# Patient Record
Sex: Female | Born: 1990 | Race: Black or African American | Hispanic: No | Marital: Single | Smoking: Never smoker
Health system: Southern US, Community
[De-identification: ages and names within clinical notes are randomized; demographics above are authoritative.]

## PROBLEM LIST (undated history)

## (undated) DIAGNOSIS — G4700 Insomnia, unspecified: Secondary | ICD-10-CM

## (undated) DIAGNOSIS — F419 Anxiety disorder, unspecified: Secondary | ICD-10-CM

## (undated) DIAGNOSIS — K219 Gastro-esophageal reflux disease without esophagitis: Secondary | ICD-10-CM

## (undated) DIAGNOSIS — T7840XA Allergy, unspecified, initial encounter: Secondary | ICD-10-CM

## (undated) DIAGNOSIS — D649 Anemia, unspecified: Secondary | ICD-10-CM

## (undated) HISTORY — DX: Insomnia, unspecified: G47.00

## (undated) HISTORY — DX: Anemia, unspecified: D64.9

## (undated) HISTORY — DX: Allergy, unspecified, initial encounter: T78.40XA

## (undated) HISTORY — PX: NO PAST SURGERIES: SHX2092

## (undated) HISTORY — DX: Anxiety disorder, unspecified: F41.9

---
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
## 2014-08-17 ENCOUNTER — Ambulatory Visit (INDEPENDENT_AMBULATORY_CARE_PROVIDER_SITE_OTHER): Payer: Self-pay | Admitting: Family Medicine

## 2014-08-17 ENCOUNTER — Encounter: Payer: Self-pay | Admitting: Family Medicine

## 2014-08-17 VITALS — BP 132/96 | HR 86 | Temp 98.4°F | Resp 16 | Ht 68.0 in | Wt 304.0 lb

## 2014-08-17 DIAGNOSIS — J309 Allergic rhinitis, unspecified: Secondary | ICD-10-CM | POA: Insufficient documentation

## 2014-08-17 DIAGNOSIS — K219 Gastro-esophageal reflux disease without esophagitis: Secondary | ICD-10-CM

## 2014-08-17 DIAGNOSIS — N946 Dysmenorrhea, unspecified: Secondary | ICD-10-CM

## 2014-08-17 MED ORDER — SUCRALFATE 1 G PO TABS: ORAL_TABLET | ORAL | Status: DC

## 2014-08-17 MED ORDER — NORETHINDRONE ACET-ETHINYL EST 1-20 MG-MCG PO TABS: 1.0000 | ORAL_TABLET | Freq: Every day | ORAL | Status: DC

## 2014-08-17 NOTE — Progress Notes (Signed)
Subjective:     Patient ID: Brianna Villarreal, female   DOB: November 03, 1990, 24 y.o.   MRN: [id number]  HPI  Chief Complaint  Patient presents with  . Menstrual Problem    Patient comes in office today to discuss heavy menstrual cycles. Patient states that over the past year menses have become more heavy and she has had increading pain in her abdomen and lower back described as cramping. Patient states that she has tried taking otc Midol, Naproxen and pamprin with little relief.   Reports she is not sexually active. Also states she gets nocturnal reflux which will occasionally wake her up at night. Has tried omeprazole for  4 weeks with little improvement. Denies tobacco or significant caffeine use. Has started going to a gym.   Review of Systems  HENT:       Reports allergy exacerbation in the late Spring which culminated in her going to an ER with wheezing.  Genitourinary:       States menses are regular for 3-6 days with the first days heavy. First day of LMP 7/26.       Objective:   Physical Exam  Constitutional: She appears well-developed and well-nourished. No distress.  Abdominal: Soft. There is no tenderness. There is no guarding.       Assessment:    1. Gastroesophageal reflux disease, esophagitis presence not specified - sucralfate (CARAFATE) 1 G tablet; One at bedtime  Dispense: 30 tablet; Refill: 1  2. Dysmenorrhea - norethindrone-ethinyl estradiol (JUNEL 1/20) 1-20 MG-MCG tablet; Take 1 tablet by mouth daily.  Dispense: 1 Package; Refill: 2    Plan:      Discussed Head of bed elevation on 6 inch blocks. Phone f/u in 4 weeks.

## 2014-08-17 NOTE — Patient Instructions (Addendum)
Place legs at head of bed up on 6 inch blocks to minimize nighttime reflux. Try new medication, sucralfate at bedtime.

## 2014-11-22 ENCOUNTER — Other Ambulatory Visit: Payer: Self-pay | Admitting: Family Medicine

## 2015-05-23 DIAGNOSIS — R1030 Lower abdominal pain, unspecified: Secondary | ICD-10-CM | POA: Diagnosis not present

## 2015-05-23 DIAGNOSIS — Z79899 Other long term (current) drug therapy: Secondary | ICD-10-CM | POA: Diagnosis not present

## 2015-05-23 DIAGNOSIS — N390 Urinary tract infection, site not specified: Secondary | ICD-10-CM | POA: Insufficient documentation

## 2015-05-23 LAB — CBC
HEMATOCRIT: 40 % (ref 35.0–47.0)
Hemoglobin: 13 g/dL (ref 12.0–16.0)
MCH: 23.4 pg — ABNORMAL LOW (ref 26.0–34.0)
MCHC: 32.4 g/dL (ref 32.0–36.0)
MCV: 72.1 fL — AB (ref 80.0–100.0)
Platelets: 280 10*3/uL (ref 150–440)
RBC: 5.56 MIL/uL — ABNORMAL HIGH (ref 3.80–5.20)
RDW: 15.4 % — AB (ref 11.5–14.5)
WBC: 10.5 10*3/uL (ref 3.6–11.0)

## 2015-05-23 LAB — COMPREHENSIVE METABOLIC PANEL
ALT: 15 U/L (ref 14–54)
AST: 22 U/L (ref 15–41)
Albumin: 4.1 g/dL (ref 3.5–5.0)
Alkaline Phosphatase: 66 U/L (ref 38–126)
Anion gap: 7 (ref 5–15)
BUN: 8 mg/dL (ref 6–20)
CHLORIDE: 103 mmol/L (ref 101–111)
CO2: 27 mmol/L (ref 22–32)
Calcium: 9.4 mg/dL (ref 8.9–10.3)
Creatinine, Ser: 0.92 mg/dL (ref 0.44–1.00)
GFR calc Af Amer: >60 mL/min (ref >60)
Glucose, Bld: 112 mg/dL — ABNORMAL HIGH (ref 65–99)
POTASSIUM: 4 mmol/L (ref 3.5–5.1)
Sodium: 137 mmol/L (ref 135–145)
Total Bilirubin: 0.4 mg/dL (ref 0.3–1.2)
Total Protein: 8.2 g/dL — ABNORMAL HIGH (ref 6.5–8.1)

## 2015-05-23 NOTE — ED Notes (Signed)
Patient with onset of lower abdominal pain that goes across the lower back which started on Monday. States N/V x 3 today. Had diarrhea x1 yesterday. Denies dysuria, urgency or frequency. Denies possibility of pregnancy. Denies vaginal bleeding or vaginal discharge.

## 2015-05-24 ENCOUNTER — Emergency Department
Admission: EM | Admit: 2015-05-24 | Discharge: 2015-05-24 | Disposition: A | Discharge status: Home / Self Care | Payer: Managed Care, Other (non HMO) | Attending: Emergency Medicine | Admitting: Emergency Medicine

## 2015-05-24 DIAGNOSIS — R109 Unspecified abdominal pain: Secondary | ICD-10-CM

## 2015-05-24 DIAGNOSIS — R1030 Lower abdominal pain, unspecified: Secondary | ICD-10-CM

## 2015-05-24 DIAGNOSIS — R10A0 Flank pain, unspecified side: Secondary | ICD-10-CM

## 2015-05-24 DIAGNOSIS — N390 Urinary tract infection, site not specified: Secondary | ICD-10-CM

## 2015-05-24 LAB — URINALYSIS COMPLETE WITH MICROSCOPIC (ARMC ONLY)
Bilirubin Urine: NEGATIVE
Glucose, UA: NEGATIVE mg/dL
Ketones, ur: NEGATIVE mg/dL
Leukocytes, UA: NEGATIVE
Nitrite: NEGATIVE
Protein, ur: 100 mg/dL — AB
Specific Gravity, Urine: 1.016 (ref 1.005–1.030)
pH: 5 (ref 5.0–8.0)

## 2015-05-24 LAB — POCT PREGNANCY, URINE: Preg Test, Ur: NEGATIVE

## 2015-05-24 MED ORDER — ONDANSETRON 4 MG PO TBDP
ORAL_TABLET | ORAL | Status: AC
  Filled 2015-05-24: qty 1

## 2015-05-24 MED ORDER — HYDROCODONE-ACETAMINOPHEN 5-325 MG PO TABS: 1.0000 | ORAL_TABLET | Freq: Four times a day (QID) | ORAL | Status: DC | PRN

## 2015-05-24 MED ORDER — KETOROLAC TROMETHAMINE 30 MG/ML IJ SOLN
30.0000 mg | Freq: Once | INTRAMUSCULAR | Status: AC
  Administered 2015-05-24: 30 mg via INTRAMUSCULAR
  Filled 2015-05-24: qty 1

## 2015-05-24 MED ORDER — ONDANSETRON 4 MG PO TBDP: 4.0000 mg | ORAL_TABLET | Freq: Three times a day (TID) | ORAL | Status: DC | PRN

## 2015-05-24 MED ORDER — RACEPINEPHRINE HCL 2.25 % IN NEBU
INHALATION_SOLUTION | RESPIRATORY_TRACT | Status: AC
  Filled 2015-05-24: qty 0.5

## 2015-05-24 MED ORDER — DEXTROSE 5 % IV SOLN
1.0000 g | INTRAVENOUS | Status: DC
  Filled 2015-05-24: qty 10

## 2015-05-24 MED ORDER — SULFAMETHOXAZOLE-TRIMETHOPRIM 800-160 MG PO TABS: 1.0000 | ORAL_TABLET | Freq: Two times a day (BID) | ORAL | Status: DC

## 2015-05-24 MED ORDER — ONDANSETRON 4 MG PO TBDP
4.0000 mg | ORAL_TABLET | Freq: Once | ORAL | Status: AC | PRN
  Administered 2015-05-24: 4 mg via ORAL

## 2015-05-24 MED ORDER — IBUPROFEN 800 MG PO TABS: 800.0000 mg | ORAL_TABLET | Freq: Three times a day (TID) | ORAL | Status: DC | PRN

## 2015-05-24 MED ORDER — KETOROLAC TROMETHAMINE 30 MG/ML IJ SOLN
30.0000 mg | Freq: Once | INTRAMUSCULAR | Status: DC
  Filled 2015-05-24: qty 1

## 2015-05-24 MED ORDER — ONDANSETRON HCL 4 MG/2ML IJ SOLN
4.0000 mg | Freq: Once | INTRAMUSCULAR | Status: DC
  Filled 2015-05-24: qty 2

## 2015-05-24 MED ORDER — CEFTRIAXONE SODIUM 1 G IJ SOLR
1.0000 g | Freq: Once | INTRAMUSCULAR | Status: AC
  Administered 2015-05-24: 1 g via INTRAMUSCULAR
  Filled 2015-05-24: qty 10

## 2015-05-24 MED ORDER — SODIUM CHLORIDE 0.9 % IV BOLUS (SEPSIS): 1000.0000 mL | Freq: Once | INTRAVENOUS | Status: DC

## 2015-05-24 NOTE — Discharge Instructions (Signed)
1. Take antibiotic as prescribed (Septra DS twice daily 7 days). 2. You may take pain medicines as needed (Motrin/Norco #15). 3. Take nausea medicine as needed (Zofran #20).  4. Drink plenty of fluids daily. 5. Return to the ER for worsening symptoms, persistent vomiting, fever, difficulty breathing or other concerns.  Abdominal Pain, Adult Many things can cause belly (abdominal) pain. Most times, the belly pain is not dangerous. Many cases of belly pain can be watched and treated at home. HOME CARE   Do not take medicines that help you go poop (laxatives) unless told to by your doctor.  Only take medicine as told by your doctor.  Eat or drink as told by your doctor. Your doctor will tell you if you should be on a special diet. GET HELP IF:  You do not know what is causing your belly pain.  You have belly pain while you are sick to your stomach (nauseous) or have runny poop (diarrhea).  You have pain while you pee or poop.  Your belly pain wakes you up at night.  You have belly pain that gets worse or better when you eat.  You have belly pain that gets worse when you eat fatty foods.  You have a fever. GET HELP RIGHT AWAY IF:   The pain does not go away within 2 hours.  You keep throwing up (vomiting).  The pain changes and is only in the right or left part of the belly.  You have bloody or tarry looking poop. MAKE SURE YOU:   Understand these instructions.  Will watch your condition.  Will get help right away if you are not doing well or get worse.   This information is not intended to replace advice given to you by your health care provider. Make sure you discuss any questions you have with your health care provider.   Document Released: 06/10/2007 Document Revised: 01/12/2014 Document Reviewed: 08/31/2012 Elsevier Interactive Patient Education 2016 Elsevier Inc.  Flank Pain Flank pain is pain in your side. The flank is the area of your side between your upper  belly (abdomen) and your back. Pain in this area can be caused by many different things. HOME CARE Home care and treatment will depend on the cause of your pain.  Rest as told by your doctor.  Drink enough fluids to keep your pee (urine) clear or pale yellow.  Only take medicine as told by your doctor.  Tell your doctor about any changes in your pain.  Follow up with your doctor. GET HELP RIGHT AWAY IF:   Your pain does not get better with medicine.   You have new symptoms or your symptoms get worse.  Your pain gets worse.   You have belly (abdominal) pain.   You are short of breath.   You always feel sick to your stomach (nauseous).   You keep throwing up (vomiting).   You have puffiness (swelling) in your belly.   You feel light-headed or you pass out (faint).   You have blood in your pee.  You have a fever or lasting symptoms for more than 2-3 days.  You have a fever and your symptoms suddenly get worse. MAKE SURE YOU:   Understand these instructions.  Will watch your condition.  Will get help right away if you are not doing well or get worse.   This information is not intended to replace advice given to you by your health care provider. Make sure you discuss any questions  you have with your health care provider.   Document Released: 10/01/2007 Document Revised: 01/12/2014 Document Reviewed: 08/06/2011 Elsevier Interactive Patient Education 2016 Elsevier Inc.  Urinary Tract Infection A urinary tract infection (UTI) can occur any place along the urinary tract. The tract includes the kidneys, ureters, bladder, and urethra. A type of germ called bacteria often causes a UTI. UTIs are often helped with antibiotic medicine.  HOME CARE   If given, take antibiotics as told by your doctor. Finish them even if you start to feel better.  Drink enough fluids to keep your pee (urine) clear or pale yellow.  Avoid tea, drinks with caffeine, and bubbly  (carbonated) drinks.  Pee often. Avoid holding your pee in for a long time.  Pee before and after having sex (intercourse).  Wipe from front to back after you poop (bowel movement) if you are a woman. Use each tissue only once. GET HELP RIGHT AWAY IF:   You have back pain.  You have lower belly (abdominal) pain.  You have chills.  You feel sick to your stomach (nauseous).  You throw up (vomit).  Your burning or discomfort with peeing does not go away.  You have a fever.  Your symptoms are not better in 3 days. MAKE SURE YOU:   Understand these instructions.  Will watch your condition.  Will get help right away if you are not doing well or get worse.   This information is not intended to replace advice given to you by your health care provider. Make sure you discuss any questions you have with your health care provider.   Document Released: 06/10/2007 Document Revised: 01/12/2014 Document Reviewed: 07/23/2011 Elsevier Interactive Patient Education [employer]2016 Elsevier Inc.

## 2015-05-24 NOTE — ED Provider Notes (Signed)
[facility] Emergency Department Provider Note   ____________________________________________  Time seen: Approximately 2:06 AM  I have reviewed the triage vital signs and the nursing notes.   HISTORY  Chief Complaint Abdominal Pain    HPI Brianna Villarreal is a 25 y.o. female who presents to the ED from home with a chief complaint of abdominal pain. Patient reports onset of suprapubic pain 4 days ago, radiating to back. Describes as a pressure and has been taking Gas-X without relief. Reports nausea and vomiting 3 today, one episode of diarrhea yesterday. Denies associated fever, chills, chest pain, shortness of breath, hematuria. Denies vaginal bleeding or discharge. Denies recent travel or trauma. Nothing makes her symptoms better or worse.   Past medical history None for ovarian cysts or endometriosis  Patient Active Problem List   Diagnosis Date Noted  . Allergic rhinitis 08/17/2014    Past Surgical History  Procedure Laterality Date  . No past surgeries      Current Outpatient Rx  Name  Route  Sig  Dispense  Refill  . MICROGESTIN 1-20 MG-MCG tablet      TAKE ONE TABLET BY MOUTH ONCE DAILY   21 tablet   11   . sucralfate (CARAFATE) 1 G tablet      One at bedtime   30 tablet   1     Allergies Review of patient's allergies indicates no known allergies.  No family history on file.  Social History Social History  Substance Use Topics  . Smoking status: Never Smoker   . Smokeless tobacco: Never Used  . Alcohol Use: 0.0 oz/week    0 Standard drinks or equivalent per week     Comment: occasional    Review of Systems  Constitutional: No fever/chills. Eyes: No visual changes. ENT: No sore throat. Cardiovascular: Denies chest pain. Respiratory: Denies shortness of breath. Gastrointestinal: Positive for low abdominal pain.  Positive for nausea and vomiting.  No diarrhea.  No constipation. Genitourinary: Negative for  dysuria. Musculoskeletal: Positive for back pain. Skin: Negative for rash. Neurological: Negative for headaches, focal weakness or numbness.  10-point ROS otherwise negative.  ____________________________________________   PHYSICAL EXAM:  VITAL SIGNS: ED Triage Vitals  Enc Vitals Group     BP 05/23/15 2311 134/89 mmHg     Pulse Rate 05/23/15 2311 97     Resp 05/23/15 2311 18     Temp 05/23/15 2311 98.4 F (36.9 C)     Temp Source 05/23/15 2311 Oral     SpO2 05/23/15 2311 100 %     Weight 05/23/15 2311 389 lb (176.449 kg)     Height 05/23/15 2311  (1.727 m)     Head Cir --      Peak Flow --      Pain Score 05/23/15 2312 8     Pain Loc --      Pain Edu? --      Excl. in GC? --     Constitutional: Alert and oriented. Well appearing and in no acute distress. Eyes: Conjunctivae are normal. PERRL. EOMI. Head: Atraumatic. Nose: No congestion/rhinnorhea. Mouth/Throat: Mucous membranes are moist.  Oropharynx non-erythematous. Neck: No stridor.   Cardiovascular: Normal rate, regular rhythm. Grossly normal heart sounds.  Good peripheral circulation. Respiratory: Normal respiratory effort.  No retractions. Lungs CTAB. Gastrointestinal: Soft and mildly tender to palpation suprapubic area without rebound or guarding. No distention. No abdominal bruits. Mild left CVA tenderness. Musculoskeletal: No lower extremity tenderness nor edema.  No joint effusions. Neurologic:  Normal speech and language. No gross focal neurologic deficits are appreciated. No gait instability. Skin:  Skin is warm, dry and intact. No rash noted. Psychiatric: Mood and affect are normal. Speech and behavior are normal.  ____________________________________________   LABS (all labs ordered are listed, but only abnormal results are displayed)  Labs Reviewed  COMPREHENSIVE METABOLIC PANEL - Abnormal; Notable for the following:    Glucose, Bld 112 (*)    Total Protein 8.2 (*)    All other components  within normal limits  CBC - Abnormal; Notable for the following:    RBC 5.56 (*)    MCV 72.1 (*)    MCH 23.4 (*)    RDW 15.4 (*)    All other components within normal limits  URINALYSIS COMPLETEWITH MICROSCOPIC ([facility] ONLY) - Abnormal; Notable for the following:    Color, Urine YELLOW (*)    APPearance CLOUDY (*)    Hgb urine dipstick 1+ (*)    Protein, ur 100 (*)    Bacteria, UA MANY (*)    Squamous Epithelial / LPF TOO NUMEROUS TO COUNT (*)    All other components within normal limits  URINE CULTURE  POC URINE PREG, ED  POCT PREGNANCY, URINE   ____________________________________________  EKG  None ____________________________________________  RADIOLOGY  None ____________________________________________   PROCEDURES  Procedure(s) performed: None  Critical Care performed: No  ____________________________________________   INITIAL IMPRESSION / ASSESSMENT AND PLAN / ED COURSE  Pertinent labs & imaging results that were available during my care of the patient were reviewed by me and considered in my medical decision making (see chart for details).  25 year old female who presents with suprapubic and left flank pain. She is afebrile, not tachycardic and well-appearing on exam. Laboratory urinalysis demonstrate UTI, possibly early pyelonephritis. Will initiate IV fluid resuscitation, IV antibiotic, analgesic and reassess.  ----------------------------------------- 2:51 AM on 05/24/2015 -----------------------------------------  Nursing unable to obtain IV access. Patient requested intramuscular medications. Toradol and Rocephin were given IM. Patient tolerated PO without emesis. Plan for Septra prescription, analgesia and close follow-up with PCP. Strict return precautions given. Patient and mother verbalize understanding and agree with plan of care. ____________________________________________   FINAL CLINICAL IMPRESSION(S) / ED DIAGNOSES  Final diagnoses:   Lower abdominal pain  Flank pain  UTI (lower urinary tract infection)      NEW MEDICATIONS STARTED DURING THIS VISIT:  New Prescriptions   No medications on file     Note:  This document was prepared using Dragon voice recognition software and may include unintentional dictation errors.    Irean HongJade J Sung, MD 05/24/15 [phone]

## 2015-05-24 NOTE — ED Notes (Addendum)
Pt reports lower abd pain x 4 days, radiating to back, described as pressure.  Pt reports taking gas-x w/o relief.  Pt denies urinary sx.  Pt reports n/v 3 times today.  Pt reports 1 episode diarrhea yesterday.  Pt reports location of pain and tenderness moves, today more tender on left, but yesterday right hurt more, pt now localizes pain to left flank area. Pt NAD at this time, resp equal and unlabored, skin warm and dry.

## 2015-05-25 LAB — URINE CULTURE: SPECIAL REQUESTS: NORMAL

## 2015-05-28 ENCOUNTER — Encounter: Payer: Self-pay | Admitting: Family Medicine

## 2015-05-28 ENCOUNTER — Ambulatory Visit (INDEPENDENT_AMBULATORY_CARE_PROVIDER_SITE_OTHER): Payer: Managed Care, Other (non HMO) | Admitting: Family Medicine

## 2015-05-28 VITALS — BP 122/86 | HR 68 | Temp 98.0°F | Resp 16 | Wt 296.4 lb

## 2015-05-28 DIAGNOSIS — N3090 Cystitis, unspecified without hematuria: Secondary | ICD-10-CM

## 2015-05-28 DIAGNOSIS — K219 Gastro-esophageal reflux disease without esophagitis: Secondary | ICD-10-CM | POA: Diagnosis not present

## 2015-05-28 NOTE — Progress Notes (Signed)
Subjective:     Patient ID: Brianna Villarreal, female   DOB: 09/26/1990, 25 y.o.   MRN: [id number]  HPI  Chief Complaint  Patient presents with  . Follow-up    Patient presents in office today for hospital follow up, patient [facility]24/15 with complaints of abdominal pain. Patient was diagnosed with UTI, possibly early pyelonephritis and prescribed Septra. Patient reports today that symptoms are improving, the only thing she complains of is abdominal cramping and diarrhea from taking antibiotic.   Urinalysis had numerous squamous cells and urine culture did grow out a specific organism. Currently working at [employer] in the [employer] section.   Review of Systems     Objective:   Physical Exam  Constitutional: She appears well-developed and well-nourished. No distress.  Abdominal: Soft. There is no tenderness.  Genitourinary:  No CVA tenderness       Assessment:    1. Cystitis: resolved.     Plan:    Stop antibiotic after 5 days. Call or return for further sx.

## 2015-05-28 NOTE — Patient Instructions (Signed)
Stop antibiotic after you have completed 5 days. If urinary symptoms return or diarrhea does not go away return for further evaluation.

## 2015-07-11 ENCOUNTER — Encounter: Payer: Self-pay | Admitting: Family Medicine

## 2015-07-11 ENCOUNTER — Ambulatory Visit (INDEPENDENT_AMBULATORY_CARE_PROVIDER_SITE_OTHER): Payer: Managed Care, Other (non HMO) | Admitting: Family Medicine

## 2015-07-11 VITALS — BP 130/80 | HR 88 | Temp 97.8°F | Resp 16 | Ht 68.0 in | Wt 299.0 lb

## 2015-07-11 DIAGNOSIS — M222X1 Patellofemoral disorders, right knee: Secondary | ICD-10-CM

## 2015-07-11 MED ORDER — MELOXICAM 15 MG PO TABS: 15.0000 mg | ORAL_TABLET | Freq: Every day | ORAL | Status: DC

## 2015-07-11 NOTE — Progress Notes (Signed)
Subjective:     Patient ID: Melvern BankerIeshia D Khiev, female   DOB: 07/13/1990, 25 y.o.   MRN: [id number]  HPI  Chief Complaint  Patient presents with  . Knee Pain    right knee x'1 week  States her knee bother her the most going up and down stairs. Also quit using the treadmill and elliptical in the last week due to pain. Has also noticed it will lock up at times. Has been taking ibuprofen 600 mg occasionally for discomfort. No prior hx of knee surgery or significant injury.   Review of Systems     Objective:   Physical Exam  Constitutional: She appears well-developed and well-nourished. No distress.  Musculoskeletal:  Right knee: KF/KE 5/5, No effusion or tenderness on palpation. Increased knee discomfort/apprehension with flexion > 90 degrees. Knee ligaments stable, McMurray's test negative.       Assessment:    1. Patella-femoral syndrome, right - meloxicam (MOBIC) 15 MG tablet; Take 1 tablet (15 mg total) by mouth daily.  Dispense: 30 tablet; Refill: 0    Plan:    Discussed icing knee, cross training, and using tylenol as needed.

## 2015-07-11 NOTE — Patient Instructions (Signed)
Avoid elliptical and treadmill for now. Cross train with upper body exercise/weights for now. Ice your knee for 20 minutes at the end of your day. May also use Tylenol up to 3000 mg/ day for pain as well as meloxicam. Call me if not improving in a week or two.

## 2015-07-26 ENCOUNTER — Telehealth: Payer: Self-pay | Admitting: Family Medicine

## 2015-07-26 NOTE — Telephone Encounter (Signed)
Pt informed and voiced understanding of results. 

## 2015-07-26 NOTE — Telephone Encounter (Signed)
Pt states she was seen on 07/11/2015 and was advised to call and give an update.  Pt states the medication is helping but she is still having some pain in her right knee.  CB#[phone]/MW

## 2015-07-26 NOTE — Telephone Encounter (Signed)
Let's stay the course and give it another two weeks on medication. If not feeling better call for orthopedic referral.

## 2015-08-07 ENCOUNTER — Other Ambulatory Visit: Payer: Self-pay | Admitting: Family Medicine

## 2015-08-07 DIAGNOSIS — M222X1 Patellofemoral disorders, right knee: Secondary | ICD-10-CM

## 2015-08-08 ENCOUNTER — Emergency Department: Payer: Managed Care, Other (non HMO)

## 2015-08-08 ENCOUNTER — Encounter: Payer: Self-pay | Admitting: Emergency Medicine

## 2015-08-08 ENCOUNTER — Emergency Department
Admission: EM | Admit: 2015-08-08 | Discharge: 2015-08-08 | Disposition: A | Discharge status: Home / Self Care | Payer: Managed Care, Other (non HMO) | Attending: Student | Admitting: Student

## 2015-08-08 DIAGNOSIS — R197 Diarrhea, unspecified: Secondary | ICD-10-CM | POA: Diagnosis not present

## 2015-08-08 DIAGNOSIS — Z79899 Other long term (current) drug therapy: Secondary | ICD-10-CM | POA: Diagnosis not present

## 2015-08-08 HISTORY — DX: Gastro-esophageal reflux disease without esophagitis: K21.9

## 2015-08-08 LAB — CBC
HCT: 37.3 % (ref 35.0–47.0)
Hemoglobin: 12.6 g/dL (ref 12.0–16.0)
MCH: 24.3 pg — AB (ref 26.0–34.0)
MCHC: 33.7 g/dL (ref 32.0–36.0)
MCV: 72 fL — AB (ref 80.0–100.0)
PLATELETS: 261 10*3/uL (ref 150–440)
RBC: 5.18 MIL/uL (ref 3.80–5.20)
RDW: 15.9 % — ABNORMAL HIGH (ref 11.5–14.5)
WBC: 13.3 10*3/uL — ABNORMAL HIGH (ref 3.6–11.0)

## 2015-08-08 LAB — COMPREHENSIVE METABOLIC PANEL
ALK PHOS: 54 U/L (ref 38–126)
ALT: 15 U/L (ref 14–54)
AST: 25 U/L (ref 15–41)
Albumin: 4 g/dL (ref 3.5–5.0)
Anion gap: 6 (ref 5–15)
BILIRUBIN TOTAL: 0.5 mg/dL (ref 0.3–1.2)
BUN: 7 mg/dL (ref 6–20)
CALCIUM: 8.9 mg/dL (ref 8.9–10.3)
CHLORIDE: 105 mmol/L (ref 101–111)
CO2: 25 mmol/L (ref 22–32)
CREATININE: 0.66 mg/dL (ref 0.44–1.00)
GFR calc non Af Amer: >60 mL/min (ref >60)
GLUCOSE: 118 mg/dL — AB (ref 65–99)
Potassium: 3.6 mmol/L (ref 3.5–5.1)
Sodium: 136 mmol/L (ref 135–145)
Total Protein: 7.9 g/dL (ref 6.5–8.1)

## 2015-08-08 LAB — URINALYSIS COMPLETE WITH MICROSCOPIC (ARMC ONLY)
BILIRUBIN URINE: NEGATIVE
Glucose, UA: NEGATIVE mg/dL
Ketones, ur: NEGATIVE mg/dL
Leukocytes, UA: NEGATIVE
Nitrite: NEGATIVE
PH: 6 (ref 5.0–8.0)
PROTEIN: NEGATIVE mg/dL
Specific Gravity, Urine: 1.003 — ABNORMAL LOW (ref 1.005–1.030)

## 2015-08-08 LAB — LIPASE, BLOOD: Lipase: 14 U/L (ref 11–51)

## 2015-08-08 LAB — POCT PREGNANCY, URINE: Preg Test, Ur: NEGATIVE

## 2015-08-08 NOTE — ED Triage Notes (Signed)
Pt reports took stool softner due to constipation from taking Meloxicam, states she has been taking it for a month . Pt reports took stool softner Tuesday and had diarrhea all day Tuesday and Wednesday. Pt denies nausea or vomiting. Reports she has not eaten much due to work schedule but reports "I still feel pressure like I have to use the bathroom." Pt denies blood in stool. Pt denies other symptoms.

## 2015-08-08 NOTE — ED Provider Notes (Signed)
[facility] Emergency Department Provider Note   ____________________________________________   First MD Initiated Contact with Patient 08/08/15 0141     (approximate)  I have reviewed the triage vital signs and the nursing notes.   HISTORY  Chief Complaint No chief complaint on file.    HPI Brianna Villarreal is a 25 y.o. female with history of GERD, patellofemoral syndrome who presents for evaluation of less than 2 days of nonbloody diarrhea, gradual onset, initially severe, now mild, no modifying factors. She reports thatshe had been constipated earlier this week which she attributed to taking meloxicam for her patellofemoral syndrome. 2 days ago she took a stool softener and reports that she has been having "diarrhea ever since". One question however she confirms that she has not had any diarrhea since yesterday morning but that she "feels pressure" in her rectum in her lower abdomen as if she might have to have a bowel movement but "nothing comes out". She denies any abdominal pain, fevers or chills. No cough, sneezing or runny nose. No known sick contacts. No chest pain or difficulty breathing.   Past Medical History:  Diagnosis Date  . GERD (gastroesophageal reflux disease)     Patient Active Problem List   Diagnosis Date Noted  . GERD (gastroesophageal reflux disease) 05/28/2015  . Allergic rhinitis 08/17/2014    Past Surgical History:  Procedure Laterality Date  . NO PAST SURGERIES      Prior to Admission medications   Medication Sig Start Date End Date Taking? Authorizing Provider  acetaminophen (TYLENOL) 325 MG tablet Take 325 mg by mouth every 6 (six) hours as needed. Reported on 05/28/2015    Historical Provider, MD  meloxicam (MOBIC) 15 MG tablet TAKE 1 TABLET (15 MG TOTAL) BY MOUTH DAILY. 08/07/15   Anola Gurney, PA  MICROGESTIN 1-20 MG-MCG tablet TAKE ONE TABLET BY MOUTH ONCE DAILY 11/22/14   Anola Gurney, PA  sucralfate (CARAFATE) 1  G tablet One at bedtime 08/17/14   Anola Gurney, PA    Allergies Review of patient's allergies indicates no known allergies.  Family History  Problem Relation Age of Onset  . Hypertension Mother     Social History Social History  Substance Use Topics  . Smoking status: Never Smoker  . Smokeless tobacco: Never Used  . Alcohol use 0.0 oz/week     Comment: occasional    Review of Systems Constitutional: No fever/chills Eyes: No visual changes. ENT: No sore throat. Cardiovascular: Denies chest pain. Respiratory: Denies shortness of breath. Gastrointestinal: No abdominal pain.  No nausea, no vomiting.  + diarrhea.  + constipation. Genitourinary: Negative for dysuria. Musculoskeletal: Negative for back pain. Skin: Negative for rash. Neurological: Negative for headaches, focal weakness or numbness.  10-point ROS otherwise negative.  ____________________________________________   PHYSICAL EXAM:  VITAL SIGNS: ED Triage Vitals  Enc Vitals Group     BP 08/08/15 0120 (!) 150/95     Pulse --      Resp 08/08/15 0120 18     Temp 08/08/15 0120 99.1 F (37.3 C)     Temp Source 08/08/15 0120 Oral     SpO2 08/08/15 0120 98 %     Weight 08/08/15 0120 296 lb (134.3 kg)     Height 08/08/15 0120 5\' 8"  (1.727 m)     Head Circumference --      Peak Flow --      Pain Score 08/08/15 0121 7     Pain Loc --  Pain Edu? --      Excl. in GC? --     Constitutional: Alert and oriented. Well appearing and in no acute distress. Eyes: Conjunctivae are normal. PERRL. EOMI. Head: Atraumatic. Nose: No congestion/rhinnorhea. Mouth/Throat: Mucous membranes are moist.  Oropharynx non-erythematous. Neck: No stridor.   Cardiovascular: Normal rate, regular rhythm. Grossly normal heart sounds.  Good peripheral circulation. Respiratory: Normal respiratory effort.  No retractions. Lungs CTAB. Gastrointestinal: Soft and nontender. No distention. No rebound or guarding. No CVA  tenderness. Genitourinary: Deferred Musculoskeletal: No lower extremity tenderness nor edema.  No joint effusions. Neurologic:  Normal speech and language. No gross focal neurologic deficits are appreciated. No gait instability. Skin:  Skin is warm, dry and intact. No rash noted. Psychiatric: Mood and affect are normal. Speech and behavior are normal.  ____________________________________________   LABS (all labs ordered are listed, but only abnormal results are displayed)  Labs Reviewed  COMPREHENSIVE METABOLIC PANEL - Abnormal; Notable for the following:       Result Value   Glucose, Bld 118 (*)    All other components within normal limits  CBC - Abnormal; Notable for the following:    WBC 13.3 (*)    MCV 72.0 (*)    MCH 24.3 (*)    RDW 15.9 (*)    All other components within normal limits  URINALYSIS COMPLETEWITH MICROSCOPIC ([facility] ONLY) - Abnormal; Notable for the following:    Color, Urine YELLOW (*)    APPearance HAZY (*)    Specific Gravity, Urine 1.003 (*)    Hgb urine dipstick 3+ (*)    Bacteria, UA RARE (*)    Squamous Epithelial / LPF 6-30 (*)    All other components within normal limits  LIPASE, BLOOD  POC URINE PREG, ED  POCT PREGNANCY, URINE   ____________________________________________  EKG  none ____________________________________________  RADIOLOGY  KUB FINDINGS: The bowel gas pattern is normal. No significant retained large bowel stool. No radio-opaque calculi or other significant radiographic abnormality are seen.Phleboliths in the pelvis.  IMPRESSION: Negative. ____________________________________________   PROCEDURES  Procedure(s) performed: None  Procedures  Critical Care performed: No  ____________________________________________   INITIAL IMPRESSION / ASSESSMENT AND PLAN / ED COURSE  Pertinent labs & imaging results that were available during my care of the patient were reviewed by me and considered in my medical decision  making (see chart for details).  Brianna Villarreal is a 25 y.o. female with history of GERD, patellofemoral syndrome who presents for evaluation of less than 2 days of nonbloody diarrhea, which has improved significantly. On exam, she is very well-appearing and in no acute distress. Vital signs stable, she is afebrile. She has a benign abdominal examination and denies any abdominal pain specifically. CMP unremarkable. Urinalysis is not consistent with infection. CBC shows mild leukocytosis, negative pregnancy test, normal lipase. I discussed with the patient that her diarrhea already seems to be improving and this may be self-limiting and due to a virus. Clinical picture not consistent with acute appendicitis, cholecystitis, obstruction or perforation. Plain films were obtained, there is no obstruction, no increase in stool burden. We discussed symptomatic management, need for close follow-up, meticulous return precautions and she is comfortable with the discharge plan. DC home.  Clinical Course     ____________________________________________   FINAL CLINICAL IMPRESSION(S) / ED DIAGNOSES  Final diagnoses:  Diarrhea, unspecified type      NEW MEDICATIONS STARTED DURING THIS VISIT:  New Prescriptions   No medications on file  Note:  This document was prepared using Dragon voice recognition software and may include unintentional dictation errors.    Gayla Doss, MD 08/08/15 0400

## 2015-09-04 ENCOUNTER — Encounter: Payer: Self-pay | Admitting: Family Medicine

## 2015-09-04 ENCOUNTER — Ambulatory Visit (INDEPENDENT_AMBULATORY_CARE_PROVIDER_SITE_OTHER): Payer: Managed Care, Other (non HMO) | Admitting: Family Medicine

## 2015-09-04 VITALS — BP 104/72 | HR 82 | Temp 98.3°F | Resp 16 | Ht 68.0 in | Wt 301.4 lb

## 2015-09-04 DIAGNOSIS — M222X1 Patellofemoral disorders, right knee: Secondary | ICD-10-CM

## 2015-09-04 DIAGNOSIS — Z23 Encounter for immunization: Secondary | ICD-10-CM

## 2015-09-04 DIAGNOSIS — Z131 Encounter for screening for diabetes mellitus: Secondary | ICD-10-CM | POA: Diagnosis not present

## 2015-09-04 DIAGNOSIS — Z13220 Encounter for screening for lipoid disorders: Secondary | ICD-10-CM | POA: Diagnosis not present

## 2015-09-04 MED ORDER — NAPROXEN 500 MG PO TABS: 500.0000 mg | ORAL_TABLET | Freq: Two times a day (BID) | ORAL | 0 refills | Status: DC

## 2015-09-04 NOTE — Patient Instructions (Signed)
We will call you with the lab results tomorrow and fax your form. Let me know if the Naprosyn helps your knee. Stop the meloxicam.

## 2015-09-04 NOTE — Progress Notes (Signed)
Subjective:     Patient ID: Brianna Villarreal, female   DOB: 12/23/1990, 25 y.o.   MRN: [id number]  HPI  Chief Complaint  Patient presents with  . Employment Physical    Patient comes into office today for annual health screening, patient states that she has no questions or concerns today. Patient is due today for  Flu vaccine.Last Tdap in 2008.   Has biometric form for completion. States meloxicam made her sleepy and did not help her right patellofemoral syndrome.   Review of Systems     Objective:   Physical Exam  Constitutional: She appears well-developed and well-nourished. No distress.       Assessment:    1. Need for influenza vaccination - Flu Vaccine QUAD 36+ mos IM  2. Patella-femoral syndrome, right - naproxen (NAPROSYN) 500 MG tablet; Take 1 tablet (500 mg total) by mouth 2 (two) times daily with a meal.  Dispense: 30 tablet; Refill: 0  3. Screening for cholesterol level - Lipid panel  4. Screening for diabetes mellitus - Comprehensive metabolic panel - HgB A1c    Plan:    If knee not improving with Naprosyn will order x-ray and refer to orthopedics.

## 2015-09-05 ENCOUNTER — Telehealth: Payer: Self-pay

## 2015-09-05 LAB — COMPREHENSIVE METABOLIC PANEL
ALBUMIN: 4.1 g/dL (ref 3.5–5.5)
ALK PHOS: 73 IU/L (ref 39–117)
ALT: 13 IU/L (ref 0–32)
AST: 19 IU/L (ref 0–40)
Albumin/Globulin Ratio: 1.4 (ref 1.2–2.2)
BUN / CREAT RATIO: 12 (ref 9–23)
BUN: 10 mg/dL (ref 6–20)
Bilirubin Total: <0.2 mg/dL (ref 0.0–1.2)
CALCIUM: 9.7 mg/dL (ref 8.7–10.2)
CO2: 25 mmol/L (ref 18–29)
CREATININE: 0.81 mg/dL (ref 0.57–1.00)
Chloride: 100 mmol/L (ref 96–106)
GFR calc Af Amer: 116 mL/min/{1.73_m2} (ref >59)
GFR, EST NON AFRICAN AMERICAN: 101 mL/min/{1.73_m2} (ref >59)
GLUCOSE: 95 mg/dL (ref 65–99)
Globulin, Total: 3 g/dL (ref 1.5–4.5)
Potassium: 4.9 mmol/L (ref 3.5–5.2)
Sodium: 138 mmol/L (ref 134–144)
TOTAL PROTEIN: 7.1 g/dL (ref 6.0–8.5)

## 2015-09-05 LAB — HEMOGLOBIN A1C
Est. average glucose Bld gHb Est-mCnc: 126 mg/dL
HEMOGLOBIN A1C: 6 % — AB (ref 4.8–5.6)

## 2015-09-05 LAB — LIPID PANEL
Chol/HDL Ratio: 4.6 ratio units — ABNORMAL HIGH (ref 0.0–4.4)
Cholesterol, Total: 188 mg/dL (ref 100–199)
HDL: 41 mg/dL (ref >39)
LDL Calculated: 119 mg/dL — ABNORMAL HIGH (ref 0–99)
Triglycerides: 138 mg/dL (ref 0–149)
VLDL CHOLESTEROL CAL: 28 mg/dL (ref 5–40)

## 2015-09-05 NOTE — Telephone Encounter (Signed)
No answer/ voicemail not set up yet.  Thanks,  -Joseline 

## 2015-09-05 NOTE — Telephone Encounter (Signed)
-----   Message from Anola Gurneyobert Chauvin, [place] sent at 09/05/2015  7:36 AM EDT ----- Mildly elevated non-fasting cholesterol. 3 month sugar average is elevated though your sugar at time of lab test was normal Continue efforts to exercise 30 minutes daily. Let me know if your knee is not improving with the naprosyn. We will fax your form today.

## 2015-09-10 NOTE — Telephone Encounter (Signed)
Patient has been advised of lab report. KW 

## 2015-09-16 ENCOUNTER — Other Ambulatory Visit: Payer: Self-pay | Admitting: Family Medicine

## 2015-09-16 DIAGNOSIS — M222X1 Patellofemoral disorders, right knee: Secondary | ICD-10-CM

## 2015-12-31 ENCOUNTER — Other Ambulatory Visit: Payer: Self-pay | Admitting: Family Medicine

## 2015-12-31 DIAGNOSIS — M222X1 Patellofemoral disorders, right knee: Secondary | ICD-10-CM

## 2016-02-12 ENCOUNTER — Other Ambulatory Visit: Payer: Self-pay | Admitting: Family Medicine

## 2016-02-12 DIAGNOSIS — M222X1 Patellofemoral disorders, right knee: Secondary | ICD-10-CM

## 2016-08-18 ENCOUNTER — Ambulatory Visit: Payer: Managed Care, Other (non HMO) | Admitting: Family Medicine

## 2016-08-20 ENCOUNTER — Encounter: Payer: Self-pay | Admitting: Family Medicine

## 2016-08-20 ENCOUNTER — Ambulatory Visit (INDEPENDENT_AMBULATORY_CARE_PROVIDER_SITE_OTHER): Payer: Managed Care, Other (non HMO) | Admitting: Family Medicine

## 2016-08-20 VITALS — BP 122/84 | HR 107 | Temp 98.7°F | Resp 16 | Ht 68.0 in | Wt 310.8 lb

## 2016-08-20 DIAGNOSIS — Z131 Encounter for screening for diabetes mellitus: Secondary | ICD-10-CM

## 2016-08-20 DIAGNOSIS — Z13220 Encounter for screening for lipoid disorders: Secondary | ICD-10-CM | POA: Diagnosis not present

## 2016-08-20 DIAGNOSIS — G8929 Other chronic pain: Secondary | ICD-10-CM | POA: Diagnosis not present

## 2016-08-20 DIAGNOSIS — M25561 Pain in right knee: Secondary | ICD-10-CM

## 2016-08-20 DIAGNOSIS — Z23 Encounter for immunization: Secondary | ICD-10-CM

## 2016-08-20 NOTE — Patient Instructions (Signed)
Do schedule a  Pap smear with the provider of your choice.

## 2016-08-20 NOTE — Progress Notes (Signed)
Subjective:     Patient ID: Brianna Villarreal, female   DOB: 11/21/1990, 26 y.o.   MRN: [id number]  HPI  Chief Complaint  Patient presents with  . Employment Physical    Patient comes in office today with heath screening form to be completed for [facility]. Patient states that she is feeling well today and has no complaints or concerns.   States she is walking 30 minutes to an hour 3 x week. She currently is living with her parents in [place]. Continues to work at [employer] in the [employer] section.   Review of Systems  Genitourinary:       Not sure she has ever had a pap smear. States she is not currently sexually active. Encouraged setting up pap smear with female provider.  Musculoskeletal:       Chronic right knee pain c/w patellofemoral syndrome. Will x-ray and consider orthopedic referral.       Objective:   Physical Exam  Constitutional: She appears well-developed and well-nourished. No distress.  Cardiovascular: Normal rate and regular rhythm.   Pulmonary/Chest: Breath sounds normal.  Musculoskeletal: She exhibits no edema (of lower extremities).       Assessment:    1. Screening for cholesterol level - Lipid panel  2. Screening for diabetes mellitus - Comprehensive metabolic panel - HgB A1c  3. Chronic pain of right knee - DG Knee Complete 4 Views Right; Future  4. Need for tetanus booster - Td vaccine greater than or equal to 7yo preservative free IM    Plan:    Further f/u pending lab work and x-ray results. Will complete Biometric form and return to the patient. Encouraged pap smear with provider of her choice.

## 2016-08-21 ENCOUNTER — Telehealth: Payer: Self-pay

## 2016-08-21 LAB — COMPREHENSIVE METABOLIC PANEL
A/G RATIO: 1.5 (ref 1.2–2.2)
ALK PHOS: 84 IU/L (ref 39–117)
ALT: 13 IU/L (ref 0–32)
AST: 21 IU/L (ref 0–40)
Albumin: 4.5 g/dL (ref 3.5–5.5)
BILIRUBIN TOTAL: 0.3 mg/dL (ref 0.0–1.2)
BUN / CREAT RATIO: 14 (ref 9–23)
BUN: 11 mg/dL (ref 6–20)
CHLORIDE: 100 mmol/L (ref 96–106)
CO2: 22 mmol/L (ref 20–29)
CREATININE: 0.81 mg/dL (ref 0.57–1.00)
Calcium: 9.9 mg/dL (ref 8.7–10.2)
GFR calc Af Amer: 115 mL/min/{1.73_m2} (ref >59)
GFR calc non Af Amer: 100 mL/min/{1.73_m2} (ref >59)
GLOBULIN, TOTAL: 3.1 g/dL (ref 1.5–4.5)
Glucose: 88 mg/dL (ref 65–99)
POTASSIUM: 4.5 mmol/L (ref 3.5–5.2)
SODIUM: 140 mmol/L (ref 134–144)
Total Protein: 7.6 g/dL (ref 6.0–8.5)

## 2016-08-21 LAB — LIPID PANEL
CHOL/HDL RATIO: 3.5 ratio (ref 0.0–4.4)
Cholesterol, Total: 185 mg/dL (ref 100–199)
HDL: 53 mg/dL (ref >39)
LDL CALC: 117 mg/dL — AB (ref 0–99)
Triglycerides: 73 mg/dL (ref 0–149)
VLDL CHOLESTEROL CAL: 15 mg/dL (ref 5–40)

## 2016-08-21 LAB — HEMOGLOBIN A1C
ESTIMATED AVERAGE GLUCOSE: 126 mg/dL
HEMOGLOBIN A1C: 6 % — AB (ref 4.8–5.6)

## 2016-08-21 NOTE — Telephone Encounter (Signed)
-----   Message from Anola Gurney, [place] sent at 08/21/2016  7:34 AM EDT ----- Labs ok except A1c is mildy elevated like last year. The fasting glucose is normal. I will put your form and a copy of your labs up front for pickup.

## 2016-08-21 NOTE — Telephone Encounter (Signed)
Patient advised.KW 

## 2016-08-21 NOTE — Telephone Encounter (Signed)
[id number] 

## 2016-10-10 ENCOUNTER — Other Ambulatory Visit: Payer: Self-pay | Admitting: Family Medicine

## 2016-10-10 DIAGNOSIS — M222X1 Patellofemoral disorders, right knee: Secondary | ICD-10-CM

## 2016-10-21 ENCOUNTER — Encounter: Payer: Self-pay | Admitting: Family Medicine

## 2016-10-21 ENCOUNTER — Ambulatory Visit (INDEPENDENT_AMBULATORY_CARE_PROVIDER_SITE_OTHER): Payer: Managed Care, Other (non HMO) | Admitting: Family Medicine

## 2016-10-21 VITALS — BP 122/88 | HR 74 | Temp 98.3°F | Resp 16 | Wt 314.4 lb

## 2016-10-21 DIAGNOSIS — S39012A Strain of muscle, fascia and tendon of lower back, initial encounter: Secondary | ICD-10-CM

## 2016-10-21 DIAGNOSIS — Z23 Encounter for immunization: Secondary | ICD-10-CM | POA: Diagnosis not present

## 2016-10-21 DIAGNOSIS — Z0184 Encounter for antibody response examination: Secondary | ICD-10-CM

## 2016-10-21 MED ORDER — HYDROCODONE-ACETAMINOPHEN 5-325 MG PO TABS: ORAL_TABLET | ORAL | 0 refills | Status: DC

## 2016-10-21 NOTE — Progress Notes (Signed)
Subjective:     Patient ID: Brianna BankerIeshia D Caisse, female   DOB: 07/04/1990, 26 y.o.   MRN: [id number]  HPI  Chief Complaint  Patient presents with  . Back Pain    Patient comes in office today with complaints of mid-lower back pain for the past 4 days. Patient states that in past 7 days she was sleeping on a couch at a family members house and woke up 4 nights ago in pain. Patient states that skin is hot to the touch and reports pain when bending or squatting. Patient has taken otc Tylenol and Ibuprofen with no relief.   Reports she occasionally gets tingling down her left leg. Also will update vaccinations today.Reports childhood hx of varicella.   Review of Systems  Musculoskeletal:       States her right knee is no longer bothering her.       Objective:   Physical Exam  Constitutional: She appears well-developed and well-nourished. She appears distressed (moderate back pain worsened by changing positions. Standing on presentation  ).  Musculoskeletal:  M.S. In lower extremities 5/5. SLR to 90 degrees on right without discomfort. SLR to 45 degrees on left before increased back pain and leg tingling. Localizes pain to her left lumbar paravertebral area       Assessment:    1. Need for hepatitis A immunization - Hepatitis A vaccine adult IM  2. Need for influenza vaccination - Flu Vaccine QUAD 36+ mos IM  3. Strain of lumbar region, initial encounter - HYDROcodone-acetaminophen (NORCO/VICODIN) 5-325 MG tablet; One every 4-6 hours as needed for pain  Dispense: 20 tablet; Refill: 0  4. Immunity status testing - Varicella zoster antibody, IgG    Plan:    Discussed warm compresses, scheduling 800 mg.of ibuprofen 3 x day, and rational use of Tylenol. 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

## 2016-10-21 NOTE — Patient Instructions (Addendum)
Continue ibuprofen 800 mg 3 x day with meals. May also take tylenol 3000 mg/daily. Discussed use of warm compresses for 20 minutes several x day. Return for second Hepatitis A in > 6 months.

## 2016-12-09 ENCOUNTER — Encounter: Payer: Self-pay | Admitting: Family Medicine

## 2016-12-09 ENCOUNTER — Ambulatory Visit (INDEPENDENT_AMBULATORY_CARE_PROVIDER_SITE_OTHER): Payer: Managed Care, Other (non HMO) | Admitting: Family Medicine

## 2016-12-09 VITALS — BP 138/88 | HR 81 | Temp 98.8°F | Resp 16 | Wt 316.0 lb

## 2016-12-09 DIAGNOSIS — S66911A Strain of unspecified muscle, fascia and tendon at wrist and hand level, right hand, initial encounter: Secondary | ICD-10-CM | POA: Diagnosis not present

## 2016-12-09 NOTE — Patient Instructions (Addendum)
Ice your hand for 20 minutes after work. Start ibuprofen 600 mg. 3 x day with food. See if you can work at a different bench to rest your right hand.

## 2016-12-09 NOTE — Progress Notes (Signed)
Subjective:     Patient ID: Brianna Villarreal, female   DOB: 09/19/1990, 26 y.o.   MRN: [id number] Chief Complaint  Patient presents with  . Hand Pain    Pt complains of right hand pain that started about 10 days ago. Pt describes the pain as dull until she tries to open her hand then the pain is sharp. The pain is constant and gradually worsening. She uses her hands a lot for work.    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 Has applied ice and used ibuprofen on occasion with little improvement. No numbness or tingling reported in her fingers.  Review of Systems     Objective:   Physical Exam  Constitutional: She appears well-developed and well-nourished. No distress.  Musculoskeletal:  Hand grips 5/5 symmetrically. Right WF/WE 5/5 with mild pain over dorsum of her hand. Tender over her right third metacarpal area. Negative testing for DeQuervain's.       Assessment:    1. Hand strain, right, initial encounter     Plan:    Work excuse for 7 days to allow her to rest her hand while at work. Discussed icing and ibuprofen.

## 2016-12-17 ENCOUNTER — Encounter: Payer: Self-pay | Admitting: Family Medicine

## 2016-12-17 ENCOUNTER — Ambulatory Visit: Payer: Managed Care, Other (non HMO) | Admitting: Family Medicine

## 2016-12-17 VITALS — BP 124/82 | HR 71 | Temp 98.5°F | Resp 16 | Wt 314.8 lb

## 2016-12-17 DIAGNOSIS — S66911A Strain of unspecified muscle, fascia and tendon at wrist and hand level, right hand, initial encounter: Secondary | ICD-10-CM | POA: Diagnosis not present

## 2016-12-17 NOTE — Progress Notes (Signed)
Subjective:     Patient ID: Brianna Villarreal, female   DOB: 02/07/1990, 26 y.o.   MRN: [id number] Chief Complaint  Patient presents with  . Hand Pain    Patient returns to office for follow up from 12/09/16, patient states that pain in hand has not improved with use of Ibuprofen. Patient reports swelling after prolonged use of using hand and pain that radiates into her right arm.    HPI Reports that she has been compliant with icing and ibuprofen. 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 States she can close it with less pain but now has the radiating pain at times.  Review of Systems     Objective:   Physical Exam  Constitutional: She appears well-developed and well-nourished. No distress.  Musculoskeletal:  Remains tender over her right dorsal hand at 3rd metacarpal.       Assessment:    1. Hand strain, right, initial encounter; repetitive motion  injury - Ambulatory referral to Orthopedic Surgery    Plan:    We will call with orthopedic consult.

## 2016-12-17 NOTE — Patient Instructions (Signed)
We will call you about the referral. Continue icing and ibuprofen.

## 2016-12-18 ENCOUNTER — Encounter: Payer: Self-pay | Admitting: Family Medicine

## 2016-12-18 ENCOUNTER — Telehealth: Payer: Self-pay | Admitting: Family Medicine

## 2016-12-18 NOTE — Telephone Encounter (Signed)
Left detailed message notifying patient that letter is available for pick up. KW

## 2016-12-18 NOTE — Telephone Encounter (Signed)
Pt is requesting a work note to be out of work from Thursday 12/17/16 through Tuesday 12/22/16.  CB#[phone]/MW

## 2016-12-18 NOTE — Telephone Encounter (Signed)
Letter is up front for pickup. Please notify her.

## 2016-12-18 NOTE — Telephone Encounter (Signed)
Please review. KW 

## 2017-03-30 IMAGING — CR DG ABDOMEN 1V
2 series · 2 of 2 positions shown · non-contrast
Comparison: None.

CLINICAL DATA: Constipation for 1 month, diarrhea for 2 days after
taking a laxative.

EXAM:
ABDOMEN - 1 VIEW

[abdomen kub (1 of 2)]
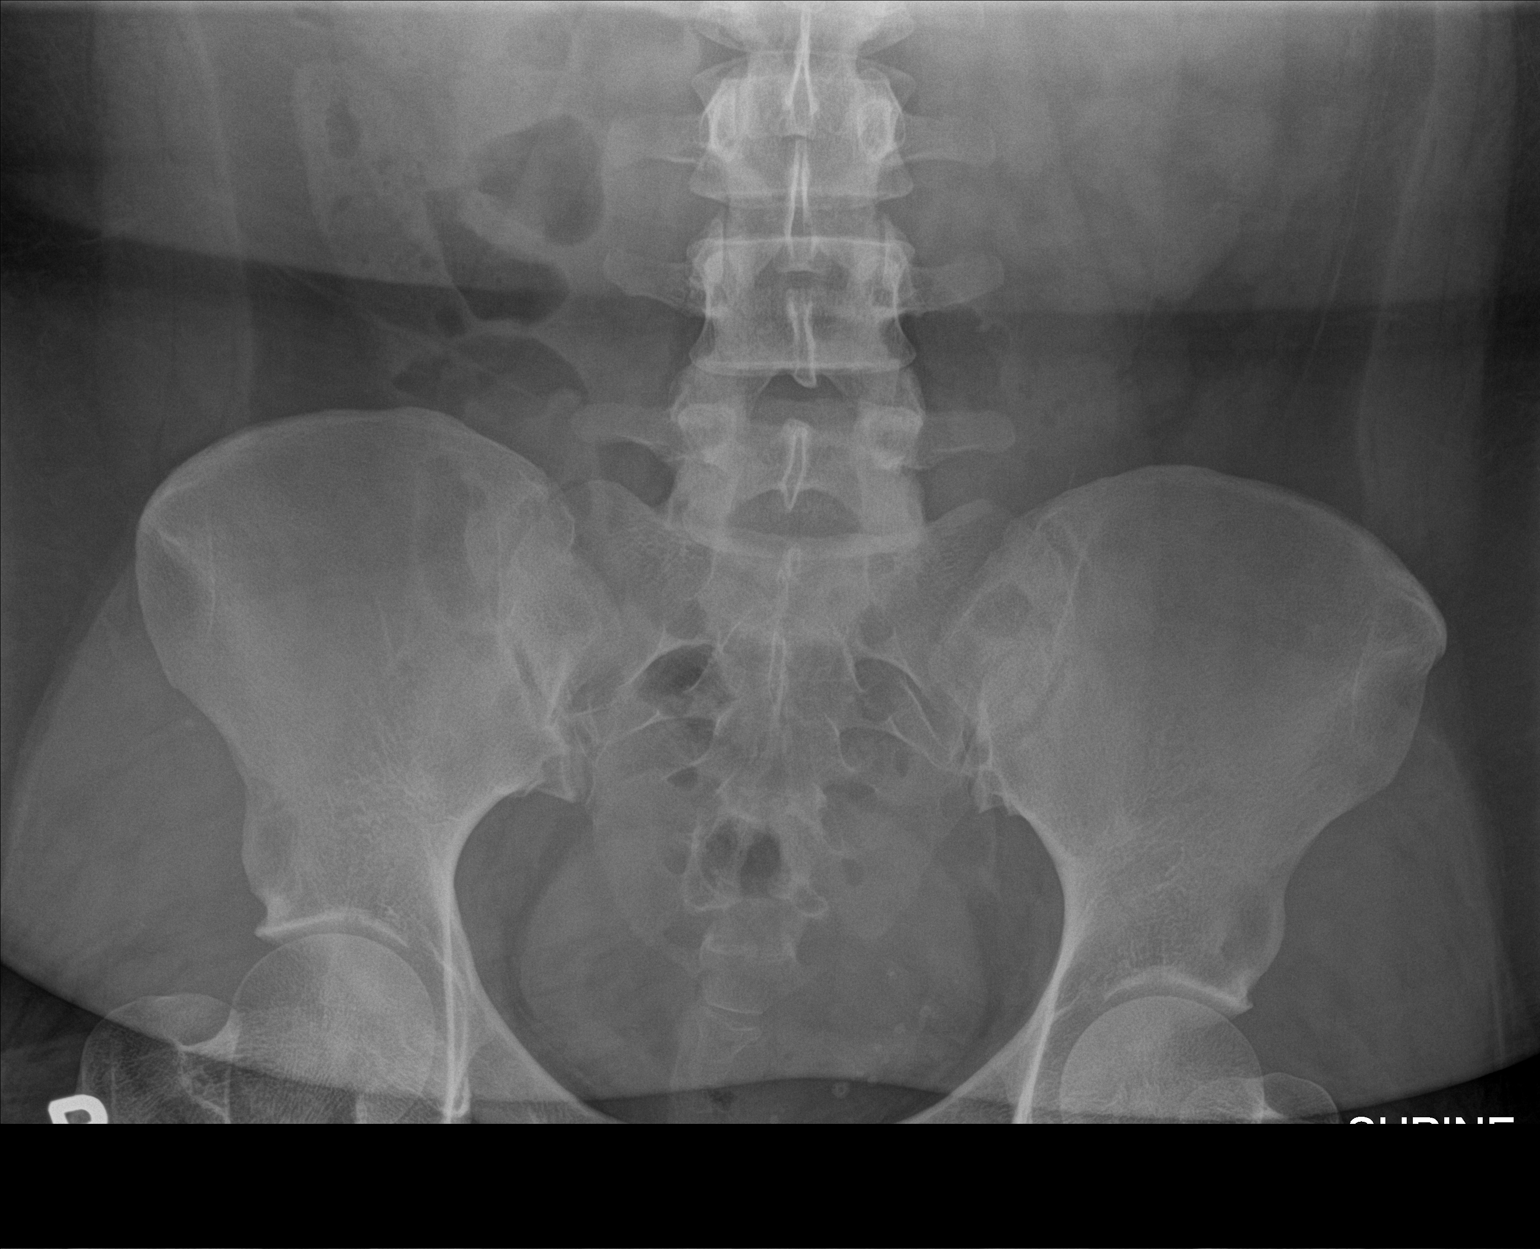

[abdomen kub (2 of 2)]
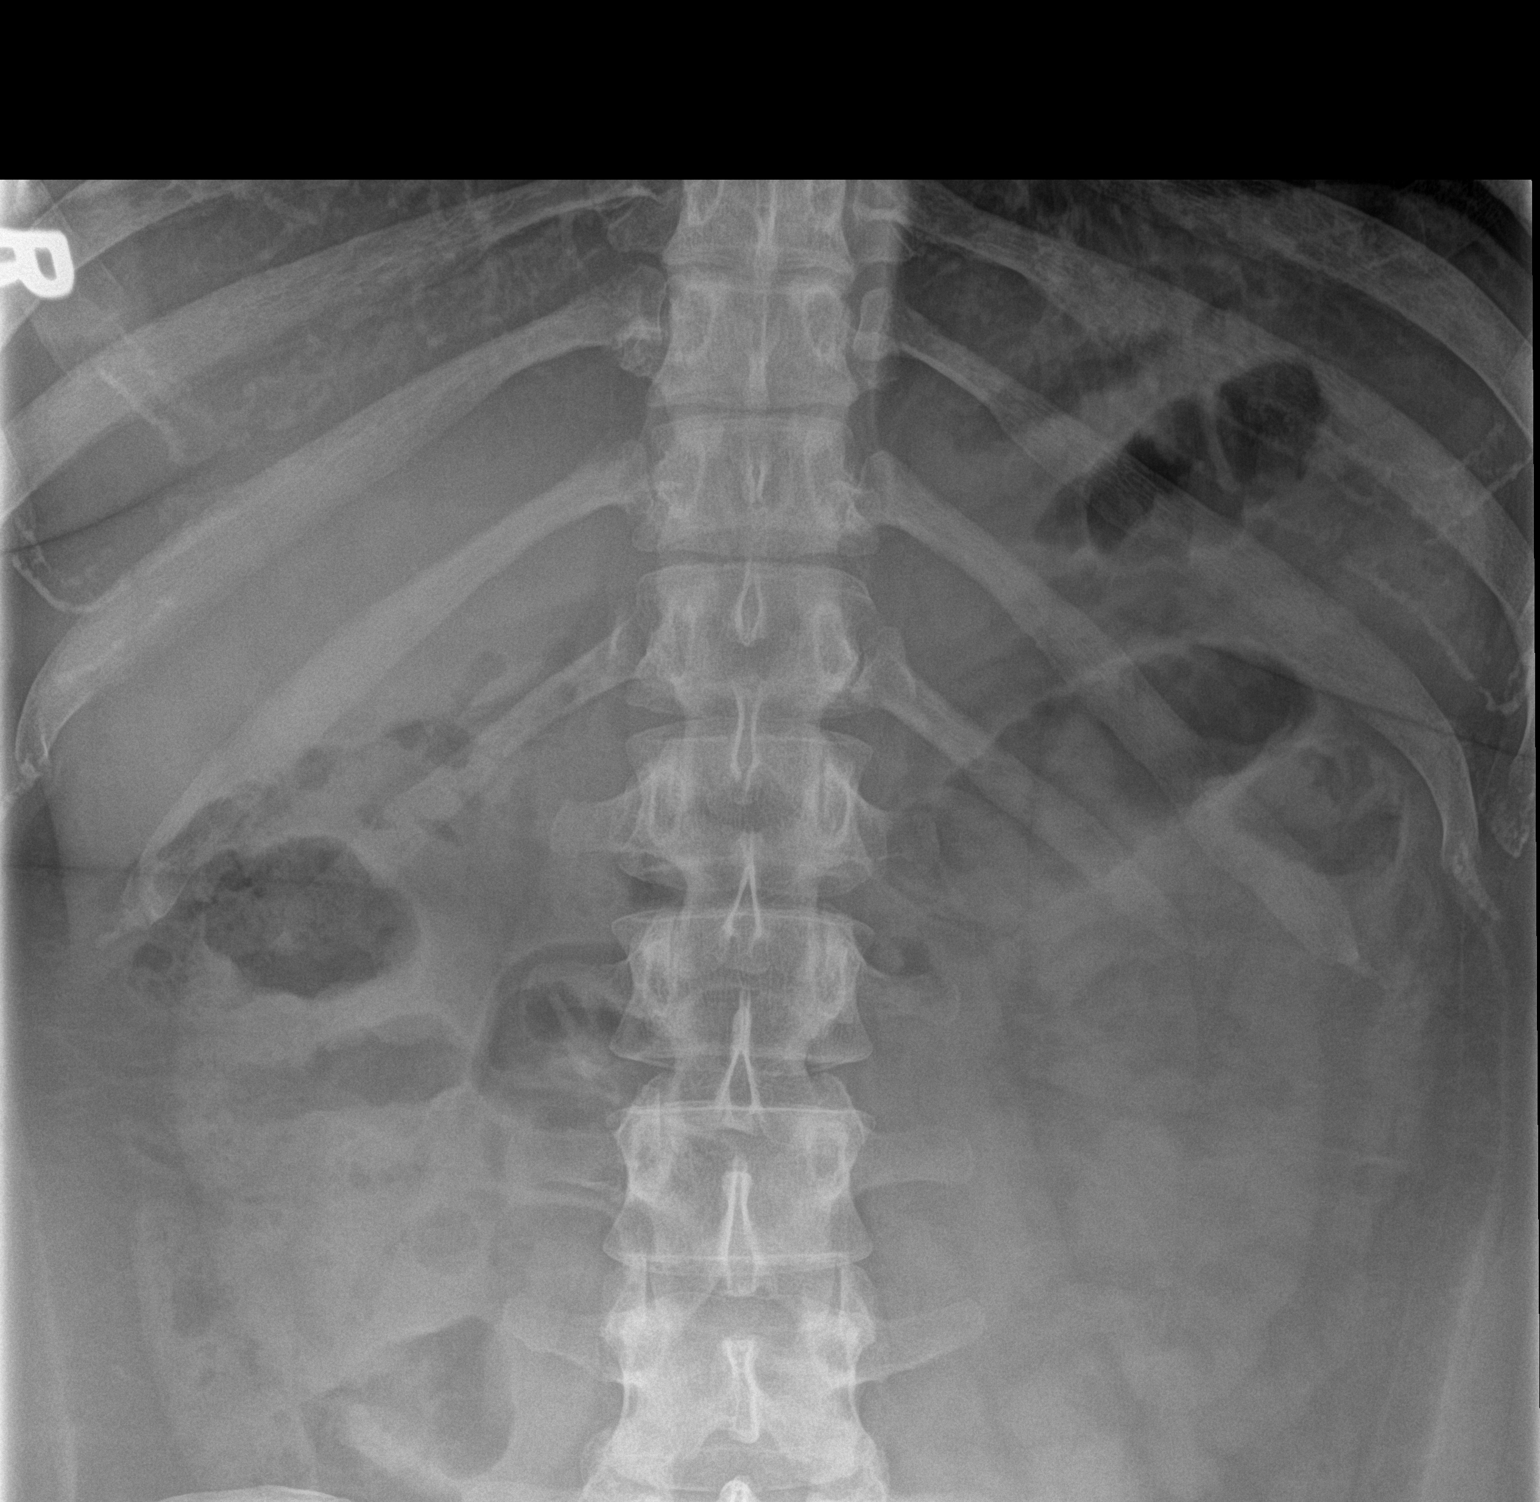

[2 of 2 positions shown; findings below may reference images not displayed]

FINDINGS: The bowel gas pattern is normal. No significant retained large bowel
stool. No radio-opaque calculi or other significant radiographic
abnormality are seen.Phleboliths in the pelvis.
IMPRESSION: Negative.

## 2017-04-22 ENCOUNTER — Other Ambulatory Visit: Payer: Self-pay | Admitting: Family Medicine

## 2017-04-22 DIAGNOSIS — M222X1 Patellofemoral disorders, right knee: Secondary | ICD-10-CM

## 2017-04-29 ENCOUNTER — Other Ambulatory Visit: Payer: Self-pay | Admitting: Family Medicine

## 2017-04-29 ENCOUNTER — Telehealth: Payer: Self-pay | Admitting: Family Medicine

## 2017-04-29 DIAGNOSIS — M222X1 Patellofemoral disorders, right knee: Secondary | ICD-10-CM

## 2017-04-29 MED ORDER — NAPROXEN 500 MG PO TABS: 500.0000 mg | ORAL_TABLET | Freq: Two times a day (BID) | ORAL | 2 refills | Status: DC

## 2017-04-29 NOTE — Telephone Encounter (Signed)
[id number] 

## 2017-04-29 NOTE — Telephone Encounter (Signed)
[facility] faxed a refill request for the following medication. Thanks CC  naproxen (NAPROSYN) 500 MG tablet

## 2017-04-29 NOTE — Telephone Encounter (Signed)
I have sent in the medication. Is she going to see her orthopedic doctor again?

## 2017-04-29 NOTE — Telephone Encounter (Signed)
Last filled 10/11/16. KW

## 2017-04-29 NOTE — Telephone Encounter (Signed)
She has seen orthopedics so need her to call and tell us what is going on.

## 2017-04-29 NOTE — Telephone Encounter (Signed)
Patient reports that it is still the same issue with hand pain. KW

## 2017-10-19 ENCOUNTER — Other Ambulatory Visit: Payer: Self-pay | Admitting: Family Medicine

## 2017-10-19 ENCOUNTER — Encounter: Payer: Self-pay | Admitting: Family Medicine

## 2017-10-19 ENCOUNTER — Ambulatory Visit (INDEPENDENT_AMBULATORY_CARE_PROVIDER_SITE_OTHER): Payer: Managed Care, Other (non HMO) | Admitting: Family Medicine

## 2017-10-19 VITALS — BP 126/76 | HR 94 | Temp 98.8°F | Wt 321.2 lb

## 2017-10-19 DIAGNOSIS — Z0000 Encounter for general adult medical examination without abnormal findings: Secondary | ICD-10-CM

## 2017-10-19 DIAGNOSIS — K219 Gastro-esophageal reflux disease without esophagitis: Secondary | ICD-10-CM

## 2017-10-19 DIAGNOSIS — Z23 Encounter for immunization: Secondary | ICD-10-CM | POA: Diagnosis not present

## 2017-10-19 DIAGNOSIS — Z131 Encounter for screening for diabetes mellitus: Secondary | ICD-10-CM | POA: Diagnosis not present

## 2017-10-19 DIAGNOSIS — Z13220 Encounter for screening for lipoid disorders: Secondary | ICD-10-CM | POA: Diagnosis not present

## 2017-10-19 DIAGNOSIS — G4700 Insomnia, unspecified: Secondary | ICD-10-CM

## 2017-10-19 MED ORDER — TRAZODONE HCL 50 MG PO TABS: 25.0000 mg | ORAL_TABLET | Freq: Every evening | ORAL | 0 refills | Status: DC | PRN

## 2017-10-19 NOTE — Patient Instructions (Addendum)
Encourage getting back to daily walking program once cleared after surgery. Let me know how you are doing on the trazodone. 1.Keep the same bedtime every night 2.Use your bed for sleep and sex only 3. Avoid exercise or stimulating electronic activity (video games/smart phones) prior to bedtime 4.Avoid alcohol or caffeine 2-3 hours before bedtime 5.Take a bath/shower prior to bedtime. This will help cool your body down and prepare it for sleep. 6.If you can't sleep, get up and do a sleep inducing activity like reading,

## 2017-10-19 NOTE — Addendum Note (Signed)
Addended by: Jeanene Erb on: 10/19/2017 03:51 PM   Modules accepted: Orders

## 2017-10-19 NOTE — Progress Notes (Signed)
  Subjective:     Patient ID: Brianna Villarreal, female   DOB: Nov 03, 1990, 27 y.o.   MRN: [id number] Chief Complaint  Patient presents with  . Annual Exam    Patient present today for her complete physcial. Patient states she is having some fatigue.   HPI She has had surgery on her right elbow and is due for surgical f/u 10/28/17. She has continued to work third shift @ [employer] in [place]. States she has hard time sleeping since surgery. Mom who accompanies feels she may be depressed though [name] does not feel that way per PHQ 9. Has not been exercising since surgery.  Review of Systems General: Feeling well HEENT: regular dental visits; has not had a need for eye exam. Cardiovascular: no chest pain, shortness of breath, or palpitations GI: occasional heartburn treated with antacids;,no change in bowel habits or blood in the stool GU:  no change in bladder habits. States it has been over a year since she was sexually active. Encourage to get pap smear from female provider of her choice. Psychiatric: not depressed Musculoskeletal: Right knee not significantly bothering her at this time (patellofemoral syndrome)    Objective:   Physical Exam  Constitutional: She appears well-developed and well-nourished. No distress.  Eyes: PERRLA Neck: no thyromegaly, tenderness or nodules, no cervical adenopathy ENT: TM's intact without inflammation; No tonsillar enlargement or exudate, Lungs: Clear Heart : RRR without murmur or gallop Abd: bowel sounds present, soft, non-tender, no organomegaly Extremities: no edema      Assessment:    1. Screening for diabetes mellitus - Comprehensive metabolic panel  2. Screening for cholesterol level - Lipid panel  3. Need for influenza vaccination  4. Annual physical exam  5. Gastroesophageal reflux disease without esophagitis: controlled with otc medication  6. Insomnia, unspecified type: trial on trazodone    Plan:    Further f/u pending  lab work. Phone f/u regarding trazodone in next week or two.

## 2018-02-03 ENCOUNTER — Other Ambulatory Visit: Payer: Self-pay | Admitting: Family Medicine

## 2018-02-03 DIAGNOSIS — M222X1 Patellofemoral disorders, right knee: Secondary | ICD-10-CM

## 2019-01-17 ENCOUNTER — Other Ambulatory Visit: Payer: Self-pay

## 2019-01-17 ENCOUNTER — Ambulatory Visit: Payer: Managed Care, Other (non HMO) | Attending: Internal Medicine

## 2019-01-17 DIAGNOSIS — Z20822 Contact with and (suspected) exposure to covid-19: Secondary | ICD-10-CM

## 2019-01-18 LAB — NOVEL CORONAVIRUS, NAA: SARS-CoV-2, NAA: NOT DETECTED

## 2019-02-13 ENCOUNTER — Ambulatory Visit (INDEPENDENT_AMBULATORY_CARE_PROVIDER_SITE_OTHER)
Admission: RE | Admit: 2019-02-13 | Discharge: 2019-02-13 | Disposition: A | Discharge status: Home / Self Care | Payer: Managed Care, Other (non HMO) | Source: Ambulatory Visit

## 2019-02-13 DIAGNOSIS — M542 Cervicalgia: Secondary | ICD-10-CM | POA: Diagnosis not present

## 2019-02-13 MED ORDER — CYCLOBENZAPRINE HCL 5 MG PO TABS: 5.0000 mg | ORAL_TABLET | Freq: Three times a day (TID) | ORAL | 0 refills | Status: DC | PRN

## 2019-02-13 NOTE — Discharge Instructions (Addendum)
Take the naproxen that you already have at home. I am sending in a muscle relaxer to help with your discomfort  Rest, heat/ice Follow up as needed for continued or worsening symptoms
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
## 2019-03-30 ENCOUNTER — Other Ambulatory Visit: Payer: Self-pay | Admitting: Physician Assistant

## 2019-03-30 DIAGNOSIS — M222X1 Patellofemoral disorders, right knee: Secondary | ICD-10-CM

## 2019-03-30 NOTE — Telephone Encounter (Signed)
[facility] ref[facility] request for the following medications:  naproxen (NAPROSYN) 500 MG tablet    Please advise.  Thanks, [employer]

## 2019-04-03 ENCOUNTER — Telehealth: Payer: Self-pay | Admitting: Physician Assistant

## 2019-04-03 NOTE — Telephone Encounter (Signed)
[facility] faxed refill request for the following medications: ° °naproxen (NAPROSYN) 500 MG tablet ° ° ° °Please advise. ° °Thanks, °[facility] ° °

## 2019-04-04 NOTE — Telephone Encounter (Signed)
Brianna Villarreal is out of office this week. I contacted patient to discuss need for Naproxen, patient stated it was for chronic right knee pain, I advised patient that [facility] Peters b is no longer at practice and since her last visit was in 2019 she would need to establish with a new provider here in the office. I offered patient an appointment which she agreed to but in the Month of April the providers schedules appear to be shorter and are booked. I wouldn't be able to get patient appointment till May. Please advise. KW

## 2019-04-06 NOTE — Telephone Encounter (Signed)
Attempted to reach patient byt voicemail box is full if patient returns call she needs to be advised to schedule a virtual or telephone visit with PA in office. KW

## 2019-04-21 NOTE — Progress Notes (Signed)
Established patient visit     Patient: Brianna Villarreal   DOB: Nov 09, 1990   29 y.o. Female  MRN: [id number] Visit Date: 04/24/2019  Today's healthcare provider: [name], [name]  Subjective:    Chief Complaint  Patient presents with  . Knee Pain   HPI  Patient establishing care with new provider. She used to see [place] PA, but since he retired she needs to reestablish with another provider. Last time she was seen was in 2019.  She is requesting refill on Naproxen 500mg  for Patellofemoral pain syndrome of right knee, this is a chronic problem. She uses the Naproxen as needed.  Allergies: She is having itching eyes, watery, burning eyes. Scratchy throat in the morning, sneezing and pressure in her head. She has tried Zyrtec, Claritin, Mucinex sinus and allergy. Reports that the medicine don't last long. She has also tried the [facility].  Patient Active Problem List   Diagnosis Date Noted  . GERD (gastroesophageal reflux disease) 05/28/2015  . Allergic rhinitis 08/17/2014   Past Medical History:  Diagnosis Date  . GERD (gastroesophageal reflux disease)    Allergies  Allergen Reactions  . Latex Hives       Medications: Outpatient Medications Prior to Visit  Medication Sig  . cyclobenzaprine (FLEXERIL) 5 MG tablet Take 1 tablet (5 mg total) by mouth 3 (three) times daily as needed for muscle spasms.  . naproxen (NAPROSYN) 500 MG tablet TAKE 1 TABLET (500 MG TOTAL) BY MOUTH 2 (TWO) TIMES DAILY WITH A MEAL.  . traZODone (DESYREL) 50 MG tablet Take 0.5-1 tablets (25-50 mg total) by mouth at bedtime as needed for sleep.  Marland Kitchen MICROGESTIN 1-20 MG-MCG tablet TAKE ONE TABLET BY MOUTH ONCE DAILY (Patient not taking: Reported on 10/19/2017)   No facility-administered medications prior to visit.    Review of Systems       Objective:    BP 138/88   Pulse 94   Temp (!) 97.1 F (36.2 C) (Temporal)   Resp 16   Ht 5\' 8"  (1.727 m)   Wt (!) 328 lb (148.8 kg)   LMP  04/11/2019   SpO2 98%   BMI 49.87 kg/m  BP Readings from Last 3 Encounters:  04/24/19 138/88  10/19/17 126/76  12/17/16 124/82   Wt Readings from Last 3 Encounters:  04/24/19 (!) 328 lb (148.8 kg)  10/19/17 (!) 321 lb 3.2 oz (145.7 kg)  12/17/16 (!) 314 lb 12.8 oz (142.8 kg)      Physical Exam Vitals reviewed.  Constitutional:      General: She is not in acute distress.    Appearance: Normal appearance. She is well-developed. She is obese. She is not ill-appearing or diaphoretic.  Cardiovascular:     Rate and Rhythm: Normal rate and regular rhythm.     Heart sounds: Normal heart sounds. No murmur. No friction rub. No gallop.   Pulmonary:     Effort: Pulmonary effort is normal. No respiratory distress.     Breath sounds: Normal breath sounds. No wheezing or rales.  Musculoskeletal:     Cervical back: Normal range of motion and neck supple.  Neurological:     Mental Status: She is alert.       No results found for any visits on 04/24/19.    Assessment & Plan:    1. Seasonal allergies Worsening despite using flonase and zyrtec. Will change flonase to flonase sensimist (she can use one spray each nostril up to 4 times daily) for  better control of breakthrough symptoms. Advised to continue Zyrtec. May add sudafed prn. Optivar eye drops given as below for ocular symptoms. She will f/u in 4 weeks for CPE.  - azelastine (OPTIVAR) 0.05 % ophthalmic solution; Place 1 drop into both eyes 2 (two) times daily.  Dispense: 6 mL; Refill: 12 - fluticasone (FLONASE SENSIMIST) 27.5 MCG/SPRAY nasal spray; Place 2 sprays into the nose daily.  Dispense: 10 g; Refill: 12  2. Patellofemoral pain syndrome of right knee Stable. Diagnosis pulled for medication refill. Continue current medical treatment plan. - naproxen (NAPROSYN) 500 MG tablet; Take 1 tablet (500 mg total) by mouth 2 (two) times daily with a meal.  Dispense: 180 tablet; Refill: 1   No follow-ups on file.     Delmer Islam, [name], have reviewed all documentation for this visit. The documentation on 04/24/19 for the exam, diagnosis, procedures, and orders are all accurate and complete.   Reine Just  [facility] [phone] (phone) [phone] (fax)  [facility]

## 2019-04-24 ENCOUNTER — Encounter: Payer: Self-pay | Admitting: Physician Assistant

## 2019-04-24 ENCOUNTER — Ambulatory Visit: Payer: Managed Care, Other (non HMO) | Admitting: Physician Assistant

## 2019-04-24 ENCOUNTER — Other Ambulatory Visit: Payer: Self-pay

## 2019-04-24 VITALS — BP 138/88 | HR 94 | Temp 97.1°F | Resp 16 | Ht 68.0 in | Wt 328.0 lb

## 2019-04-24 DIAGNOSIS — J302 Other seasonal allergic rhinitis: Secondary | ICD-10-CM

## 2019-04-24 DIAGNOSIS — M222X1 Patellofemoral disorders, right knee: Secondary | ICD-10-CM | POA: Diagnosis not present

## 2019-04-24 MED ORDER — AZELASTINE HCL 0.05 % OP SOLN: 1.0000 [drp] | Freq: Two times a day (BID) | OPHTHALMIC | 12 refills | Status: DC

## 2019-04-24 MED ORDER — NAPROXEN 500 MG PO TABS: 500.0000 mg | ORAL_TABLET | Freq: Two times a day (BID) | ORAL | 1 refills | Status: DC

## 2019-04-24 MED ORDER — FLONASE SENSIMIST 27.5 MCG/SPRAY NA SUSP: 2.0000 | Freq: Every day | NASAL | 12 refills | Status: DC

## 2019-04-24 NOTE — Patient Instructions (Signed)
Allergies, Adult °An allergy is when your body's defense system (immune system) overreacts to an otherwise harmless substance (allergen) that you breathe in or eat or something that touches your skin. When you come into contact with something that you are allergic to, your immune system produces certain proteins (antibodies). These proteins cause cells to release chemicals (histamines) that trigger the symptoms of an allergic reaction. °Allergies often affect the nasal passages (allergic rhinitis), eyes (allergic conjunctivitis), skin (atopic dermatitis), and stomach. Allergies can be mild or severe. Allergies cannot spread from person to person (are not contagious). They can develop at any age and may be outgrown. °What increases the risk? °You may be at greater risk of allergies if other people in your family have allergies. °What are the signs or symptoms? °Symptoms depend on what type of allergy you have. They may include: °· Runny, stuffy nose. °· Sneezing. °· Itchy mouth, ears, or throat. °· Postnasal drip. °· Sore throat. °· Itchy, red, watery, or puffy eyes. °· Skin rash or hives. °· Stomach pain. °· Vomiting. °· Diarrhea. °· Bloating. °· Wheezing or coughing. °People with a severe allergy to food, medicine, or an insect bite may have a life-threatening allergic reaction (anaphylaxis). Symptoms of anaphylaxis include: °· Hives. °· Itching. °· Flushed face. °· Swollen lips, tongue, or mouth. °· Tight or swollen throat. °· Chest pain or tightness in the chest. °· Trouble breathing or shortness of breath. °· Rapid heartbeat. °· Dizziness or fainting. °· Vomiting. °· Diarrhea. °· Pain in the abdomen. °How is this diagnosed? °This condition is diagnosed based on: °· Your symptoms. °· Your family and medical history. °· A physical exam. °You may need to see a health care provider who specializes in treating allergies (allergist). You may also have tests, including: °· Skin tests to see which allergens are causing  your symptoms, such as: °? Skin prick test. In this test, your skin is pricked with a tiny needle and exposed to small amounts of possible allergens to see if your skin reacts. °? Intradermal skin test. In this test, a small amount of allergen is injected under your skin to see if your skin reacts. °? Patch test. In this test, a small amount of allergen is placed on your skin and then your skin is covered with a bandage. Your health care provider will check your skin after a couple of days to see if a rash has developed. °· Blood tests. °· Challenges tests. In this test, you inhale a small amount of allergen by mouth to see if you have an allergic reaction. °You may also be asked to: °· Keep a food diary. A food diary is a record of all the foods and drinks you have in a day and any symptoms you experience. °· Practice an elimination diet. An elimination diet involves eliminating specific foods from your diet and then adding them back in one by one to find out if a certain food causes an allergic reaction. °How is this treated? °Treatment for allergies depends on your symptoms. Treatment may include: °· Cold compresses to soothe itching and swelling. °· Eye drops. °· Nasal sprays. °· Using a saline spray or container (neti pot) to flush out the nose (nasal irrigation). These methods can help clear away mucus and keep the nasal passages moist. °· Using a humidifier. °· Oral antihistamines or other medicines to block allergic reaction and inflammation. °· Skin creams to treat rashes or itching. °· Diet changes to eliminate food allergy triggers. °·   Repeated exposure to tiny amounts of allergens to build up a tolerance and prevent future allergic reactions (immunotherapy). These include: °? Allergy shots. °? Oral treatment. This involves taking small doses of an allergen under the tongue (sublingual immunotherapy). °· Emergency epinephrine injection (auto-injector) in case of an allergic emergency. This is a  self-injectable, pre-measured medicine that must be given within the first few minutes of a serious allergic reaction. °Follow these instructions at home: ° °  ° °  ° °· Avoid known allergens whenever possible. °· If you suffer from airborne allergens, wash out your nose daily. You can do this with a saline spray or a neti pot to flush out your nose (nasal irrigation). °· Take over-the-counter and prescription medicines only as told by your health care provider. °· Keep all follow-up visits as told by your health care provider. This is important. °· If you are at risk of a severe allergic reaction (anaphylaxis), keep your auto-injector with you at all times. °· If you have ever had anaphylaxis, wear a medical alert bracelet or necklace that states you have a severe allergy. °Contact a health care provider if: °· Your symptoms do not improve with treatment. °Get help right away if: °· You have symptoms of anaphylaxis, such as: °? Swollen mouth, tongue, or throat. °? Pain or tightness in your chest. °? Trouble breathing or shortness of breath. °? Dizziness or fainting. °? Severe abdominal pain, vomiting, or diarrhea. °This information is not intended to replace advice given to you by your health care provider. Make sure you discuss any questions you have with your health care provider. °Document Revised: 03/17/2017 Document Reviewed: 07/10/2015 °Elsevier Patient Education © 2020 Elsevier Inc. ° °

## 2019-05-10 NOTE — Progress Notes (Signed)
I,Brianna Villarreal,acting as a scribe for [employer], [name].,have documented all relevant documentation on the behalf of Brianna Daring, [name],as directed by  Brianna Daring, [name] while in the presence of Brianna Villarreal, [place].  Complete physical exam   Patient: Brianna Villarreal   DOB: 03/31/1990   29 y.o. Female  MRN: [id number] Visit Date: 05/11/2019  Today's healthcare provider: [name], [name]   Chief Complaint  Patient presents with  . Annual Exam   Subjective    Brianna Villarreal is a 29 y.o. female who presents today for a complete physical exam.   She reports consuming a general diet. The patient does not participate in regular exercise at present. She generally feels well. She reports sleeping fairly well. She does not have additional problems to discuss today.  HPI    Past Medical History:  Diagnosis Date  . GERD (gastroesophageal reflux disease)    Past Surgical History:  Procedure Laterality Date  . NO PAST SURGERIES     Social History   Socioeconomic History  . Marital status: Single    Spouse name: Not on file  . Number of children: Not on file  . Years of education: Not on file  . Highest education level: Not on file  Occupational History  . Not on file  Tobacco Use  . Smoking status: Never Smoker  . Smokeless tobacco: Never Used  Substance and Sexual Activity  . Alcohol use: Yes    Alcohol/week: 0.0 standard drinks    Comment: occasional  . Drug use: No  . Sexual activity: Not Currently    Birth control/protection: Condom  Other Topics Concern  . Not on file  Social History Narrative  . Not on file   Social Determinants of Health   Financial Resource Strain:   . Difficulty of Paying Living Expenses:   Food Insecurity:   . Worried About Charity fundraiser in the Last Year:   . Arboriculturist in the Last Year:   Transportation Needs:   . Film/video editor (Medical):   Marland Kitchen Lack of Transportation  (Non-Medical):   Physical Activity:   . Days of Exercise per Week:   . Minutes of Exercise per Session:   Stress:   . Feeling of Stress :   Social Connections:   . Frequency of Communication with Friends and Family:   . Frequency of Social Gatherings with Friends and Family:   . Attends Religious Services:   . Active Member of Clubs or Organizations:   . Attends Archivist Meetings:   Marland Kitchen Marital Status:   Intimate Partner Violence:   . Fear of Current or Ex-Partner:   . Emotionally Abused:   Marland Kitchen Physically Abused:   . Sexually Abused:    Family Status  Relation Name Status  . Mother  Alive  . Father  Alive  . Brother  Alive  . MGM  Deceased  . MGF  Deceased  . PGM  Deceased  . PGF  Deceased   Family History  Problem Relation Age of Onset  . Hypertension Mother    Allergies  Allergen Reactions  . Latex Hives    Patient Care Team: Brianna Daring, [name] as PCP - General (Family Medicine)   Medications: Outpatient Medications Prior to Visit  Medication Sig  . azelastine (OPTIVAR) 0.05 % ophthalmic solution Place 1 drop into both eyes 2 (two) times daily.  . cyclobenzaprine (FLEXERIL) 5 MG  tablet Take 1 tablet (5 mg total) by mouth 3 (three) times daily as needed for muscle spasms.  . fluticasone (FLONASE SENSIMIST) 27.5 MCG/SPRAY nasal spray Place 2 sprays into the nose daily.  . naproxen (NAPROSYN) 500 MG tablet Take 1 tablet (500 mg total) by mouth 2 (two) times daily with a meal.  . traZODone (DESYREL) 50 MG tablet Take 0.5-1 tablets (25-50 mg total) by mouth at bedtime as needed for sleep.   No facility-administered medications prior to visit.    Review of Systems  Constitutional: Negative.   HENT: Negative.   Eyes: Positive for itching.  Respiratory: Negative.   Cardiovascular: Negative.   Gastrointestinal: Positive for nausea.  Endocrine: Negative.   Genitourinary: Negative.   Musculoskeletal: Negative.   Skin: Negative.     Allergic/Immunologic: Positive for environmental allergies.  Neurological: Negative.   Hematological: Negative.   Psychiatric/Behavioral: Negative.     Last CBC Lab Results  Component Value Date   WBC 13.3 (H) 08/08/2015   HGB 12.6 08/08/2015   HCT 37.3 08/08/2015   MCV 72.0 (L) 08/08/2015   MCH 24.3 (L) 08/08/2015   RDW 15.9 (H) 08/08/2015   PLT 261 93/71/6967   Last metabolic panel Lab Results  Component Value Date   GLUCOSE 88 08/20/2016   NA 140 08/20/2016   K 4.5 08/20/2016   CL 100 08/20/2016   CO2 22 08/20/2016   BUN 11 08/20/2016   CREATININE 0.81 08/20/2016   GFRNONAA 100 08/20/2016   GFRAA 115 08/20/2016   CALCIUM 9.9 08/20/2016   PROT 7.6 08/20/2016   ALBUMIN 4.5 08/20/2016   LABGLOB 3.1 08/20/2016   AGRATIO 1.5 08/20/2016   BILITOT 0.3 08/20/2016   ALKPHOS 84 08/20/2016   AST 21 08/20/2016   ALT 13 08/20/2016   ANIONGAP 6 08/08/2015      Objective    BP 113/78 (BP Location: Left Arm, Patient Position: Sitting, Cuff Size: Large)   Pulse 80   Temp (!) 97.1 F (36.2 C) (Temporal)   Resp 16   Ht 5' 8"  (1.727 m)   Wt (!) 325 lb (147.4 kg)   LMP 04/11/2019   BMI 49.42 kg/m  BP Readings from Last 3 Encounters:  05/11/19 113/78  04/24/19 138/88  10/19/17 126/76   Wt Readings from Last 3 Encounters:  05/11/19 (!) 325 lb (147.4 kg)  04/24/19 (!) 328 lb (148.8 kg)  10/19/17 (!) 321 lb 3.2 oz (145.7 kg)      Physical Exam Vitals reviewed. Exam conducted with a chaperone present.  Constitutional:      General: She is not in acute distress.    Appearance: Normal appearance. She is well-developed. She is obese. She is not ill-appearing or diaphoretic.  HENT:     Head: Normocephalic and atraumatic.     Right Ear: Tympanic membrane, ear canal and external ear normal. There is no impacted cerumen.     Left Ear: Tympanic membrane, ear canal and external ear normal. There is no impacted cerumen.  Eyes:     General: No scleral icterus.       Right  eye: No discharge.        Left eye: No discharge.     Extraocular Movements: Extraocular movements intact.     Conjunctiva/sclera: Conjunctivae normal.     Pupils: Pupils are equal, round, and reactive to light.  Neck:     Thyroid: No thyroid mass, thyromegaly or thyroid tenderness.     Vascular: No JVD.     Trachea:  No tracheal deviation.  Cardiovascular:     Rate and Rhythm: Normal rate and regular rhythm.     Pulses: Normal pulses.     Heart sounds: Normal heart sounds. No murmur. No friction rub. No gallop.   Pulmonary:     Effort: Pulmonary effort is normal. No respiratory distress.     Breath sounds: Normal breath sounds and air entry. No decreased air movement. No decreased breath sounds, wheezing, rhonchi or rales.  Chest:     Chest wall: No mass or tenderness.     Breasts:        Right: Normal.        Left: Normal.  Abdominal:     General: Abdomen is flat. Bowel sounds are normal. There is no distension.     Palpations: Abdomen is soft. There is no mass.     Tenderness: There is no abdominal tenderness. There is no guarding or rebound.  Musculoskeletal:        General: No swelling or tenderness. Normal range of motion.     Cervical back: Normal range of motion and neck supple.     Right lower leg: No edema.     Left lower leg: No edema.  Lymphadenopathy:     Cervical: No cervical adenopathy.  Skin:    General: Skin is warm and dry.     Capillary Refill: Capillary refill takes less than 2 seconds.     Findings: No rash.  Neurological:     General: No focal deficit present.     Mental Status: She is alert and oriented to person, place, and time. Mental status is at baseline.  Psychiatric:        Mood and Affect: Mood normal.        Behavior: Behavior normal.        Thought Content: Thought content normal.        Judgment: Judgment normal.      Depression Screen  PHQ 2/9 Scores 05/11/2019 04/24/2019 10/19/2017  PHQ - 2 Score 0 - 1  Exception Documentation -  Patient refusal -    No results found for any visits on 05/11/19.  Assessment & Plan    Routine Health Maintenance and Physical Exam  Exercise Activities and Dietary recommendations Goals   None     Immunization History  Administered Date(s) Administered  . DTaP 05/12/1989, 08/04/1989, 09/27/1989, 10/27/1990, 03/17/1993  . Hepatitis A, Adult 10/21/2016  . Hepatitis B 10/27/1990, 12/06/1990, 08/31/1991  . HiB (PRP-OMP) 05/12/1989, 08/04/1989, 09/27/1989, 07/27/1990  . IPV 05/12/1989, 08/04/1989, 09/27/1989, 10/27/1990, 03/17/1993  . Influenza,inj,Quad PF,6+ Mos 09/04/2015, 10/21/2016, 10/19/2017  . MMR 02/26/1990, 03/17/1993, 07/20/2007  . Meningococcal Conjugate 09/24/2006  . Td 08/20/2016  . Tdap 09/24/2006    Health Maintenance  Topic Date Due  . HIV Screening  Never done  . COVID-19 Vaccine (1) Never done  . PAP SMEAR-Modifier  Never done  . INFLUENZA VACCINE  08/06/2019  . TETANUS/TDAP  08/21/2026    Discussed health benefits of physical activity, and encouraged her to engage in regular exercise appropriate for her age and condition.  1. Annual physical exam Normal physical exam today. Will check labs as below and f/u pending lab results. If labs are stable and WNL she will not need to have these rechecked for one year at her next annual physical exam. She is to call the office in the meantime if she has any acute issue, questions or concerns. Will return in a few weeks for her pap and  pelvic exam, deferred today due to menstrual cycle.   2. Encounter for screening for HIV Will check labs as below and f/u pending results. - HIV Antibody (routine testing w rflx)  3. Encounter for special screening examination for cardiovascular disorder Will check labs as below and f/u pending results. - Lipid panel  4. Encounter for screening for hematologic disorder Will check labs as below and f/u pending results. - CBC  5. Screening for metabolic disorder Will check labs  as below and f/u pending results. - Comprehensive metabolic panel - HgB B0B  6. Screening for thyroid disorder Will check labs as below and f/u pending results. - TSH  7. Class 3 severe obesity due to excess calories with serious comorbidity and body mass index (BMI) of 45.0 to 49.9 in adult [facility] Counseled patient on healthy lifestyle modifications including dieting and exercise. Will check labs as below and f/u pending results. - HgB A1c   Return in about 1 year (around 05/10/2020).     Reynolds Bowl, [name], have reviewed all documentation for this visit. The documentation on 05/11/19 for the exam, diagnosis, procedures, and orders are all accurate and complete.   Rubye Beach  [facility] [phone] (phone) [phone] (fax)  [place]

## 2019-05-11 ENCOUNTER — Encounter: Payer: Self-pay | Admitting: Physician Assistant

## 2019-05-11 ENCOUNTER — Other Ambulatory Visit: Payer: Self-pay

## 2019-05-11 ENCOUNTER — Ambulatory Visit (INDEPENDENT_AMBULATORY_CARE_PROVIDER_SITE_OTHER): Payer: Managed Care, Other (non HMO) | Admitting: Physician Assistant

## 2019-05-11 VITALS — BP 113/78 | HR 80 | Temp 97.1°F | Resp 16 | Ht 68.0 in | Wt 325.0 lb

## 2019-05-11 DIAGNOSIS — Z130 Encounter for screening for diseases of the blood and blood-forming organs and certain disorders involving the immune mechanism: Secondary | ICD-10-CM | POA: Diagnosis not present

## 2019-05-11 DIAGNOSIS — Z6841 Body Mass Index (BMI) 40.0 and over, adult: Secondary | ICD-10-CM

## 2019-05-11 DIAGNOSIS — Z114 Encounter for screening for human immunodeficiency virus [HIV]: Secondary | ICD-10-CM

## 2019-05-11 DIAGNOSIS — Z0000 Encounter for general adult medical examination without abnormal findings: Secondary | ICD-10-CM | POA: Diagnosis not present

## 2019-05-11 DIAGNOSIS — Z13228 Encounter for screening for other metabolic disorders: Secondary | ICD-10-CM

## 2019-05-11 DIAGNOSIS — Z124 Encounter for screening for malignant neoplasm of cervix: Secondary | ICD-10-CM

## 2019-05-11 DIAGNOSIS — Z136 Encounter for screening for cardiovascular disorders: Secondary | ICD-10-CM | POA: Diagnosis not present

## 2019-05-11 DIAGNOSIS — Z1329 Encounter for screening for other suspected endocrine disorder: Secondary | ICD-10-CM

## 2019-05-11 NOTE — Patient Instructions (Signed)
Health Maintenance, Female Adopting a healthy lifestyle and getting preventive care are important in promoting health and wellness. Ask your health care provider about:  The right schedule for you to have regular tests and exams.  Things you can do on your own to prevent diseases and keep yourself healthy. What should I know about diet, weight, and exercise? Eat a healthy diet   Eat a diet that includes plenty of vegetables, fruits, low-fat dairy products, and lean protein.  Do not eat a lot of foods that are high in solid fats, added sugars, or sodium. Maintain a healthy weight Body mass index (BMI) is used to identify weight problems. It estimates body fat based on height and weight. Your health care provider can help determine your BMI and help you achieve or maintain a healthy weight. Get regular exercise Get regular exercise. This is one of the most important things you can do for your health. Most adults should:  Exercise for at least 150 minutes each week. The exercise should increase your heart rate and make you sweat (moderate-intensity exercise).  Do strengthening exercises at least twice a week. This is in addition to the moderate-intensity exercise.  Spend less time sitting. Even light physical activity can be beneficial. Watch cholesterol and blood lipids Have your blood tested for lipids and cholesterol at 30 years of age, then have this test every 5 years. Have your cholesterol levels checked more often if:  Your lipid or cholesterol levels are high.  You are older than 30 years of age.  You are at high risk for heart disease. What should I know about cancer screening? Depending on your health history and family history, you may need to have cancer screening at various ages. This may include screening for:  Breast cancer.  Cervical cancer.  Colorectal cancer.  Skin cancer.  Lung cancer. What should I know about heart disease, diabetes, and high blood  pressure? Blood pressure and heart disease  High blood pressure causes heart disease and increases the risk of stroke. This is more likely to develop in people who have high blood pressure readings, are of African descent, or are overweight.  Have your blood pressure checked: ? Every 3-5 years if you are 18-39 years of age. ? Every year if you are 40 years old or older. Diabetes Have regular diabetes screenings. This checks your fasting blood sugar level. Have the screening done:  Once every three years after age 40 if you are at a normal weight and have a low risk for diabetes.  More often and at a younger age if you are overweight or have a high risk for diabetes. What should I know about preventing infection? Hepatitis B If you have a higher risk for hepatitis B, you should be screened for this virus. Talk with your health care provider to find out if you are at risk for hepatitis B infection. Hepatitis C Testing is recommended for:  Everyone born from 1945 through 1965.  Anyone with known risk factors for hepatitis C. Sexually transmitted infections (STIs)  Get screened for STIs, including gonorrhea and chlamydia, if: ? You are sexually active and are younger than 30 years of age. ? You are older than 30 years of age and your health care provider tells you that you are at risk for this type of infection. ? Your sexual activity has changed since you were last screened, and you are at increased risk for chlamydia or gonorrhea. Ask your health care provider if   you are at risk.  Ask your health care provider about whether you are at high risk for HIV. Your health care provider may recommend a prescription medicine to help prevent HIV infection. If you choose to take medicine to prevent HIV, you should first get tested for HIV. You should then be tested every 3 months for as long as you are taking the medicine. Pregnancy  If you are about to stop having your period (premenopausal) and  you may become pregnant, seek counseling before you get pregnant.  Take 400 to 800 micrograms (mcg) of folic acid every day if you become pregnant.  Ask for birth control (contraception) if you want to prevent pregnancy. Osteoporosis and menopause Osteoporosis is a disease in which the bones lose minerals and strength with aging. This can result in bone fractures. If you are 65 years old or older, or if you are at risk for osteoporosis and fractures, ask your health care provider if you should:  Be screened for bone loss.  Take a calcium or vitamin D supplement to lower your risk of fractures.  Be given hormone replacement therapy (HRT) to treat symptoms of menopause. Follow these instructions at home: Lifestyle  Do not use any products that contain nicotine or tobacco, such as cigarettes, e-cigarettes, and chewing tobacco. If you need help quitting, ask your health care provider.  Do not use street drugs.  Do not share needles.  Ask your health care provider for help if you need support or information about quitting drugs. Alcohol use  Do not drink alcohol if: ? Your health care provider tells you not to drink. ? You are pregnant, may be pregnant, or are planning to become pregnant.  If you drink alcohol: ? Limit how much you use to 0-1 drink a day. ? Limit intake if you are breastfeeding.  Be aware of how much alcohol is in your drink. In the [place] one drink equals one 12 oz bottle of beer (355 mL), one 5 oz glass of wine (148 mL), or one 1 oz glass of hard liquor (44 mL). General instructions  Schedule regular health, dental, and eye exams.  Stay current with your vaccines.  Tell your health care provider if: ? You often feel depressed. ? You have ever been abused or do not feel safe at home. Summary  Adopting a healthy lifestyle and getting preventive care are important in promoting health and wellness.  Follow your health care provider's instructions about healthy  diet, exercising, and getting tested or screened for diseases.  Follow your health care provider's instructions on monitoring your cholesterol and blood pressure. This information is not intended to replace advice given to you by your health care provider. Make sure you discuss any questions you have with your health care provider. Document Revised: 12/15/2017 Document Reviewed: 12/15/2017 Elsevier Patient Education  2020 Elsevier Inc.  

## 2019-05-12 ENCOUNTER — Telehealth: Payer: Self-pay

## 2019-05-12 LAB — CBC
Hematocrit: 37.3 % (ref 34.0–46.6)
Hemoglobin: 12.1 g/dL (ref 11.1–15.9)
MCH: 21.8 pg — ABNORMAL LOW (ref 26.6–33.0)
MCHC: 32.4 g/dL (ref 31.5–35.7)
MCV: 67 fL — ABNORMAL LOW (ref 79–97)
Platelets: 329 10*3/uL (ref 150–450)
RBC: 5.54 x10E6/uL — ABNORMAL HIGH (ref 3.77–5.28)
RDW: 17.3 % — ABNORMAL HIGH (ref 11.7–15.4)
WBC: 8.8 10*3/uL (ref 3.4–10.8)

## 2019-05-12 LAB — COMPREHENSIVE METABOLIC PANEL
ALT: 11 IU/L (ref 0–32)
AST: 22 IU/L (ref 0–40)
Albumin/Globulin Ratio: 1.3 (ref 1.2–2.2)
Albumin: 4.1 g/dL (ref 3.9–5.0)
Alkaline Phosphatase: 83 IU/L (ref 39–117)
BUN/Creatinine Ratio: 10 (ref 9–23)
BUN: 8 mg/dL (ref 6–20)
Bilirubin Total: 0.2 mg/dL (ref 0.0–1.2)
CO2: 21 mmol/L (ref 20–29)
Calcium: 9.3 mg/dL (ref 8.7–10.2)
Chloride: 103 mmol/L (ref 96–106)
Creatinine, Ser: 0.79 mg/dL (ref 0.57–1.00)
GFR calc Af Amer: 116 mL/min/{1.73_m2} (ref >59)
GFR calc non Af Amer: 101 mL/min/{1.73_m2} (ref >59)
Globulin, Total: 3.2 g/dL (ref 1.5–4.5)
Glucose: 80 mg/dL (ref 65–99)
Potassium: 4.2 mmol/L (ref 3.5–5.2)
Sodium: 140 mmol/L (ref 134–144)
Total Protein: 7.3 g/dL (ref 6.0–8.5)

## 2019-05-12 LAB — TSH: TSH: 1.51 u[IU]/mL (ref 0.450–4.500)

## 2019-05-12 LAB — LIPID PANEL
Chol/HDL Ratio: 4.3 ratio (ref 0.0–4.4)
Cholesterol, Total: 172 mg/dL (ref 100–199)
HDL: 40 mg/dL (ref >39)
LDL Chol Calc (NIH): 115 mg/dL — ABNORMAL HIGH (ref 0–99)
Triglycerides: 91 mg/dL (ref 0–149)
VLDL Cholesterol Cal: 17 mg/dL (ref 5–40)

## 2019-05-12 LAB — HIV ANTIBODY (ROUTINE TESTING W REFLEX): HIV Screen 4th Generation wRfx: NONREACTIVE

## 2019-05-12 LAB — HEMOGLOBIN A1C
Est. average glucose Bld gHb Est-mCnc: 134 mg/dL
Hgb A1c MFr Bld: 6.3 % — ABNORMAL HIGH (ref 4.8–5.6)

## 2019-05-12 NOTE — Telephone Encounter (Signed)
Result Communications   Result Notes and Comments to Patient Comment seen by patient Brianna Villarreal on 05/12/2019 9:09 AM EDT

## 2019-05-12 NOTE — Telephone Encounter (Signed)
-----   Message from Margaretann Loveless, [name] sent at 05/12/2019  9:09 AM EDT ----- Blood count is stable. Kidney and liver function are normal. Sodium, potassium, and calcium are normal. Cholesterol is normal. Thyroid is normal. A1c/suagr is borderline high at 6.3. Increased from 6.0 2 years ago. HIV screen sone once in a lifetime, unless exposed, is negative.

## 2019-06-06 ENCOUNTER — Ambulatory Visit: Payer: Self-pay | Admitting: Physician Assistant

## 2019-06-07 ENCOUNTER — Ambulatory Visit: Payer: Managed Care, Other (non HMO) | Admitting: Physician Assistant

## 2019-06-07 ENCOUNTER — Encounter: Payer: Self-pay | Admitting: Physician Assistant

## 2019-06-07 ENCOUNTER — Other Ambulatory Visit: Payer: Self-pay

## 2019-06-07 VITALS — BP 135/84 | HR 88 | Temp 97.3°F | Resp 16 | Wt 331.0 lb

## 2019-06-07 DIAGNOSIS — Z124 Encounter for screening for malignant neoplasm of cervix: Secondary | ICD-10-CM

## 2019-06-07 DIAGNOSIS — F5101 Primary insomnia: Secondary | ICD-10-CM | POA: Diagnosis not present

## 2019-06-07 MED ORDER — TRAZODONE HCL 50 MG PO TABS: 25.0000 mg | ORAL_TABLET | Freq: Every evening | ORAL | 1 refills | Status: DC | PRN

## 2019-06-07 NOTE — Progress Notes (Signed)
Established patient visit   Patient: Brianna Villarreal   DOB: 07-02-1990   29 y.o. Female  MRN: [id number] Visit Date: 06/07/2019  Today's healthcare provider: Margaretann Loveless, [name]   Chief Complaint  Patient presents with  . Gynecologic Exam   Subjective    HPI  Patient here for pap only. Had CPE but was on menstrual cycle.   Sleep: stable. Need medication refill.  Patient Active Problem List   Diagnosis Date Noted  . GERD (gastroesophageal reflux disease) 05/28/2015  . Allergic rhinitis 08/17/2014   Past Medical History:  Diagnosis Date  . GERD (gastroesophageal reflux disease)        Medications: Outpatient Medications Prior to Visit  Medication Sig  . azelastine (OPTIVAR) 0.05 % ophthalmic solution Place 1 drop into both eyes 2 (two) times daily.  . fluticasone (FLONASE SENSIMIST) 27.5 MCG/SPRAY nasal spray Place 2 sprays into the nose daily.  . naproxen (NAPROSYN) 500 MG tablet Take 1 tablet (500 mg total) by mouth 2 (two) times daily with a meal.  . traZODone (DESYREL) 50 MG tablet Take 0.5-1 tablets (25-50 mg total) by mouth at bedtime as needed for sleep.  . cyclobenzaprine (FLEXERIL) 5 MG tablet Take 1 tablet (5 mg total) by mouth 3 (three) times daily as needed for muscle spasms.   No facility-administered medications prior to visit.    Review of Systems  Constitutional: Negative.   Respiratory: Negative.   Cardiovascular: Negative.   Genitourinary: Negative.   Neurological: Negative.   Psychiatric/Behavioral: Positive for sleep disturbance (stable on medication).    Last CBC Lab Results  Component Value Date   WBC 8.8 05/11/2019   HGB 12.1 05/11/2019   HCT 37.3 05/11/2019   MCV 67 (L) 05/11/2019   MCH 21.8 (L) 05/11/2019   RDW 17.3 (H) 05/11/2019   PLT 329 05/11/2019   Last metabolic panel Lab Results  Component Value Date   GLUCOSE 80 05/11/2019   NA 140 05/11/2019   K 4.2 05/11/2019   CL 103 05/11/2019   CO2 21 05/11/2019   BUN  8 05/11/2019   CREATININE 0.79 05/11/2019   GFRNONAA 101 05/11/2019   GFRAA 116 05/11/2019   CALCIUM 9.3 05/11/2019   PROT 7.3 05/11/2019   ALBUMIN 4.1 05/11/2019   LABGLOB 3.2 05/11/2019   AGRATIO 1.3 05/11/2019   BILITOT 0.2 05/11/2019   ALKPHOS 83 05/11/2019   AST 22 05/11/2019   ALT 11 05/11/2019   ANIONGAP 6 08/08/2015   Last lipids Lab Results  Component Value Date   CHOL 172 05/11/2019   HDL 40 05/11/2019   LDLCALC 115 (H) 05/11/2019   TRIG 91 05/11/2019   CHOLHDL 4.3 05/11/2019   Last hemoglobin A1c Lab Results  Component Value Date   HGBA1C 6.3 (H) 05/11/2019      Objective    BP 135/84 (BP Location: Left Arm, Patient Position: Sitting, Cuff Size: Large)   Pulse 88   Temp (!) 97.3 F (36.3 C) (Temporal)   Resp 16   Wt (!) 331 lb (150.1 kg)   BMI 50.33 kg/m  BP Readings from Last 3 Encounters:  06/07/19 135/84  05/11/19 113/78  04/24/19 138/88   Wt Readings from Last 3 Encounters:  06/07/19 (!) 331 lb (150.1 kg)  05/11/19 (!) 325 lb (147.4 kg)  04/24/19 (!) 328 lb (148.8 kg)      Physical Exam Vitals reviewed. Exam conducted with a chaperone present.  Constitutional:      General: She is  not in acute distress.    Appearance: Normal appearance. She is obese. She is not ill-appearing.  Pulmonary:     Effort: No respiratory distress.  Abdominal:     Hernia: A hernia is present. There is no hernia in the left inguinal area or right inguinal area.  Genitourinary:    General: Normal vulva.     Exam position: Supine.     Pubic Area: No rash.      Labia:        Right: No rash, tenderness, lesion or injury.        Left: No rash, tenderness, lesion or injury.      Urethra: No prolapse, urethral pain, urethral swelling or urethral lesion.     Vagina: Normal. No vaginal discharge, erythema or tenderness.     Cervix: No discharge or erythema.     Uterus: Normal.      Adnexa: Right adnexa normal and left adnexa normal.       Right: No mass,  tenderness or fullness.         Left: No mass, tenderness or fullness.       Rectum: Normal.  Lymphadenopathy:     Lower Body: No right inguinal adenopathy. No left inguinal adenopathy.  Neurological:     Mental Status: She is alert.  Psychiatric:        Mood and Affect: Mood normal.        Behavior: Behavior normal.        Thought Content: Thought content normal.        Judgment: Judgment normal.      No results found for any visits on 06/07/19.  Assessment & Plan     1. Primary insomnia Stable. Diagnosis pulled for medication refill. Continue current medical treatment plan. - traZODone (DESYREL) 50 MG tablet; Take 0.5-1 tablets (25-50 mg total) by mouth at bedtime as needed for sleep.  Dispense: 90 tablet; Refill: 1  2. Cervical cancer screening Pap collected today. Will send as below and f/u pending results. - IGP,Aptima HPV,CtNg Age Gdln   No follow-ups on file.      Reynolds Bowl, [name], have reviewed all documentation for this visit. The documentation on 06/13/19 for the exam, diagnosis, procedures, and orders are all accurate and complete.   Rubye Beach  [facility] [phone] (phone) [phone] (fax)  [place]

## 2019-06-07 NOTE — Patient Instructions (Signed)

## 2019-06-10 LAB — IGP, APTIMA HPV, RFX 16/18,45
HPV Aptima: NEGATIVE
PAP Smear Comment: 0

## 2019-06-10 LAB — IGP,APTIMA HPV,CTNG AGE GDLN

## 2019-06-12 ENCOUNTER — Telehealth: Payer: Self-pay

## 2019-06-12 NOTE — Telephone Encounter (Signed)
Result History       Result Communications   Result Notes and Comments to Patient Comment seen by patient Brianna Villarreal on 06/12/2019 1:20 PM EDT

## 2019-06-12 NOTE — Telephone Encounter (Signed)
-----   Message from Margaretann Loveless, [name] sent at 06/12/2019  1:19 PM EDT ----- Pap is normal, HPV negative.  Will repeat in 3-5 years.

## 2019-08-24 NOTE — Progress Notes (Signed)
Established patient visit   Patient: Brianna Villarreal   DOB: [birth year]   29 y.o. Female  MRN: [id number] Visit Date: 08/25/2019  Today's healthcare provider: Margaretann Loveless, [name]   Chief Complaint  Patient presents with  . Biometric Screening   Subjective    HPI  Patient coming to have Wellness Screening Appeal Form fill out and a plan to work on her BMI. Patient reports that she feels well today. Patient reports that she does follow a well balanced diet, she is not actively exercising at this time. She does have a gym membership and plans to start trying to go before or after work. Going to see which works best for her work schedule.  Does have acute issue of menstrual complaints. Reports menstrual cycle was skipped in July 2021 and had normal start of cycle for August 2021. August cycle was prolonged and lasted for 10 days, was heavier, with intense cramping. Wants to discuss contraception options.   Patient Active Problem List   Diagnosis Date Noted  . GERD (gastroesophageal reflux disease) 05/28/2015  . Allergic rhinitis 08/17/2014   Past Medical History:  Diagnosis Date  . GERD (gastroesophageal reflux disease)    Social History   Tobacco Use  . Smoking status: Never Smoker  . Smokeless tobacco: Never Used  Substance Use Topics  . Alcohol use: Yes    Alcohol/week: 0.0 standard drinks    Comment: occasional  . Drug use: No   Allergies  Allergen Reactions  . Latex Hives       Medications: Outpatient Medications Prior to Visit  Medication Sig Note  . azelastine (OPTIVAR) 0.05 % ophthalmic solution Place 1 drop into both eyes 2 (two) times daily. (Patient not taking: Reported on 08/25/2019) 08/25/2019: PRN  . fluticasone (FLONASE SENSIMIST) 27.5 MCG/SPRAY nasal spray Place 2 sprays into the nose daily. (Patient not taking: Reported on 08/25/2019) 08/25/2019: PRN  . naproxen (NAPROSYN) 500 MG tablet Take 1 tablet (500 mg total) by mouth 2 (two) times daily with  a meal. (Patient not taking: Reported on 08/25/2019) 08/25/2019: PRN  . traZODone (DESYREL) 50 MG tablet Take 0.5-1 tablets (25-50 mg total) by mouth at bedtime as needed for sleep. (Patient not taking: Reported on 08/25/2019) 08/25/2019: PRN   No facility-administered medications prior to visit.    Review of Systems  Constitutional: Negative.   Respiratory: Negative.   Cardiovascular: Negative.   Gastrointestinal: Negative.   Genitourinary: Positive for menstrual problem.  Psychiatric/Behavioral: Negative.     Last metabolic panel Lab Results  Component Value Date   GLUCOSE 80 05/11/2019   NA 140 05/11/2019   K 4.2 05/11/2019   CL 103 05/11/2019   CO2 21 05/11/2019   BUN 8 05/11/2019   CREATININE 0.79 05/11/2019   GFRNONAA 101 05/11/2019   GFRAA 116 05/11/2019   CALCIUM 9.3 05/11/2019   PROT 7.3 05/11/2019   ALBUMIN 4.1 05/11/2019   LABGLOB 3.2 05/11/2019   AGRATIO 1.3 05/11/2019   BILITOT 0.2 05/11/2019   ALKPHOS 83 05/11/2019   AST 22 05/11/2019   ALT 11 05/11/2019   ANIONGAP 6 08/08/2015   Last lipids Lab Results  Component Value Date   CHOL 172 05/11/2019   HDL 40 05/11/2019   LDLCALC 115 (H) 05/11/2019   TRIG 91 05/11/2019   CHOLHDL 4.3 05/11/2019   Last hemoglobin A1c Lab Results  Component Value Date   HGBA1C 6.3 (H) 05/11/2019   Last thyroid functions Lab Results  Component Value Date  TSH 1.510 05/11/2019      Objective    BP 118/90   Pulse 83   Temp 98.2 F (36.8 C) (Oral)   Resp 16   Wt (!) 332 lb 8 oz (150.8 kg)   LMP 08/15/2019 (Exact Date)   SpO2 100%   BMI 50.56 kg/m  BP Readings from Last 3 Encounters:  08/25/19 118/90  06/07/19 135/84  05/11/19 113/78   Wt Readings from Last 3 Encounters:  08/25/19 (!) 332 lb 8 oz (150.8 kg)  06/07/19 (!) 331 lb (150.1 kg)  05/11/19 (!) 325 lb (147.4 kg)      Physical Exam Vitals reviewed.  Constitutional:      General: She is not in acute distress.    Appearance: Normal appearance.  She is well-developed. She is obese. She is not ill-appearing.  HENT:     Head: Normocephalic and atraumatic.  Pulmonary:     Effort: Pulmonary effort is normal. No respiratory distress.  Musculoskeletal:     Cervical back: Normal range of motion and neck supple.  Neurological:     Mental Status: She is alert.  Psychiatric:        Mood and Affect: Mood normal.        Behavior: Behavior normal.        Thought Content: Thought content normal.        Judgment: Judgment normal.     Results for orders placed or performed in visit on 08/25/19  POCT urine pregnancy  Result Value Ref Range   Preg Test, Ur Negative Negative    Assessment & Plan     1. Encounter for initial prescription of injectable contraceptive Urine pregnancy negative. Options discussed. Will start depo-provera as below. Return in 3 months for next injection.  - medroxyPROGESTERone (DEPO-PROVERA) injection 150 mg  2. Class 3 severe obesity due to excess calories with serious comorbidity and body mass index (BMI) of 50.0 to 59.9 in adult [facility]) Counseled patient on healthy lifestyle modifications including dieting and exercise.   I spent approximately 30 minutes with the patient today. Over 50% of this time was spent with counseling and educating the patient.  Return in about 3 months (around 11/25/2019) for depo provera.      Delmer Islam, [name], have reviewed all documentation for this visit. The documentation on 08/25/19 for the exam, diagnosis, procedures, and orders are all accurate and complete.   Reine Just  [facility] [phone] (phone) [phone] (fax)  [facility]

## 2019-08-25 ENCOUNTER — Other Ambulatory Visit: Payer: Self-pay

## 2019-08-25 ENCOUNTER — Ambulatory Visit (INDEPENDENT_AMBULATORY_CARE_PROVIDER_SITE_OTHER): Payer: Managed Care, Other (non HMO) | Admitting: Physician Assistant

## 2019-08-25 ENCOUNTER — Encounter: Payer: Self-pay | Admitting: Physician Assistant

## 2019-08-25 VITALS — BP 118/90 | HR 83 | Temp 98.2°F | Resp 16 | Wt 332.5 lb

## 2019-08-25 DIAGNOSIS — Z30013 Encounter for initial prescription of injectable contraceptive: Secondary | ICD-10-CM

## 2019-08-25 DIAGNOSIS — Z6841 Body Mass Index (BMI) 40.0 and over, adult: Secondary | ICD-10-CM

## 2019-08-25 LAB — POCT URINE PREGNANCY: Preg Test, Ur: NEGATIVE

## 2019-08-25 MED ORDER — MEDROXYPROGESTERONE ACETATE 150 MG/ML IM SUSP
150.0000 mg | Freq: Once | INTRAMUSCULAR | Status: AC
  Administered 2019-08-25: 150 mg via INTRAMUSCULAR

## 2019-08-25 NOTE — Patient Instructions (Signed)
Medroxyprogesterone injection [Contraceptive] What is this medicine? MEDROXYPROGESTERONE (me DROX ee proe JES te rone) contraceptive injections prevent pregnancy. They provide effective birth control for 3 months. Depo-subQ Provera 104 is also used for treating pain related to endometriosis. This medicine may be used for other purposes; ask your health care provider or pharmacist if you have questions. COMMON BRAND NAME(S): Depo-Provera, Depo-subQ Provera 104 What should I tell my health care provider before I take this medicine? They need to know if you have any of these conditions:  frequently drink alcohol  asthma  blood vessel disease or a history of a blood clot in the lungs or legs  bone disease such as osteoporosis  breast cancer  diabetes  eating disorder (anorexia nervosa or bulimia)  high blood pressure  HIV infection or AIDS  kidney disease  liver disease  mental depression  migraine  seizures (convulsions)  stroke  tobacco smoker  vaginal bleeding  an unusual or allergic reaction to medroxyprogesterone, other hormones, medicines, foods, dyes, or preservatives  pregnant or trying to get pregnant  breast-feeding How should I use this medicine? Depo-Provera Contraceptive injection is given into a muscle. Depo-subQ Provera 104 injection is given under the skin. These injections are given by a health care professional. You must not be pregnant before getting an injection. The injection is usually given during the first 5 days after the start of a menstrual period or 6 weeks after delivery of a baby. Talk to your pediatrician regarding the use of this medicine in children. Special care may be needed. These injections have been used in female children who have started having menstrual periods. Overdosage: If you think you have taken too much of this medicine contact a poison control center or emergency room at once. NOTE: This medicine is only for you. Do not  share this medicine with others. What if I miss a dose? Try not to miss a dose. You must get an injection once every 3 months to maintain birth control. If you cannot keep an appointment, call and reschedule it. If you wait longer than 13 weeks between Depo-Provera contraceptive injections or longer than 14 weeks between Depo-subQ Provera 104 injections, you could get pregnant. Use another method for birth control if you miss your appointment. You may also need a pregnancy test before receiving another injection. What may interact with this medicine? Do not take this medicine with any of the following medications:  bosentan This medicine may also interact with the following medications:  aminoglutethimide  antibiotics or medicines for infections, especially rifampin, rifabutin, rifapentine, and griseofulvin  aprepitant  barbiturate medicines such as phenobarbital or primidone  bexarotene  carbamazepine  medicines for seizures like ethotoin, felbamate, oxcarbazepine, phenytoin, topiramate  modafinil  St. John's wort This list may not describe all possible interactions. Give your health care provider a list of all the medicines, herbs, non-prescription drugs, or dietary supplements you use. Also tell them if you smoke, drink alcohol, or use illegal drugs. Some items may interact with your medicine. What should I watch for while using this medicine? This drug does not protect you against HIV infection (AIDS) or other sexually transmitted diseases. Use of this product may cause you to lose calcium from your bones. Loss of calcium may cause weak bones (osteoporosis). Only use this product for more than 2 years if other forms of birth control are not right for you. The longer you use this product for birth control the more likely you will be at risk   for weak bones. Ask your health care professional how you can keep strong bones. You may have a change in bleeding pattern or irregular periods.  Many females stop having periods while taking this drug. If you have received your injections on time, your chance of being pregnant is very low. If you think you may be pregnant, see your health care professional as soon as possible. Tell your health care professional if you want to get pregnant within the next year. The effect of this medicine may last a long time after you get your last injection. What side effects may I notice from receiving this medicine? Side effects that you should report to your doctor or health care professional as soon as possible:  allergic reactions like skin rash, itching or hives, swelling of the face, lips, or tongue  breast tenderness or discharge  breathing problems  changes in vision  depression  feeling faint or lightheaded, falls  fever  pain in the abdomen, chest, groin, or leg  problems with balance, talking, walking  unusually weak or tired  yellowing of the eyes or skin Side effects that usually do not require medical attention (report to your doctor or health care professional if they continue or are bothersome):  acne  fluid retention and swelling  headache  irregular periods, spotting, or absent periods  temporary pain, itching, or skin reaction at site where injected  weight gain This list may not describe all possible side effects. Call your doctor for medical advice about side effects. You may report side effects to FDA at 1-800-FDA-1088. Where should I keep my medicine? This does not apply. The injection will be given to you by a health care professional. NOTE: This sheet is a summary. It may not cover all possible information. If you have questions about this medicine, talk to your doctor, pharmacist, or health care provider.  2020 Elsevier/Gold Standard (2008-01-13 18:37:56)  

## 2019-11-10 ENCOUNTER — Other Ambulatory Visit: Payer: Self-pay

## 2019-11-10 ENCOUNTER — Ambulatory Visit (INDEPENDENT_AMBULATORY_CARE_PROVIDER_SITE_OTHER): Payer: Managed Care, Other (non HMO) | Admitting: Physician Assistant

## 2019-11-10 DIAGNOSIS — Z30013 Encounter for initial prescription of injectable contraceptive: Secondary | ICD-10-CM

## 2019-11-10 MED ORDER — MEDROXYPROGESTERONE ACETATE 150 MG/ML IM SUSP
150.0000 mg | Freq: Once | INTRAMUSCULAR | Status: AC
  Administered 2019-11-10: 150 mg via INTRAMUSCULAR

## 2019-11-10 NOTE — Progress Notes (Signed)
Patient comes in today for depo injection only. Feels well with no other complaints. Administered medroxyprogesterone 150mg /mL in left ventrogulteal. Patient tolerated injection well. Patient is to return on or between Jan 21- Feb 4 for next injection.

## 2019-12-05 ENCOUNTER — Encounter: Payer: Self-pay | Admitting: Physician Assistant

## 2020-01-18 ENCOUNTER — Telehealth (INDEPENDENT_AMBULATORY_CARE_PROVIDER_SITE_OTHER): Payer: Managed Care, Other (non HMO) | Admitting: Family Medicine

## 2020-01-18 ENCOUNTER — Other Ambulatory Visit: Payer: Self-pay

## 2020-01-18 DIAGNOSIS — R059 Cough, unspecified: Secondary | ICD-10-CM

## 2020-01-18 DIAGNOSIS — J0100 Acute maxillary sinusitis, unspecified: Secondary | ICD-10-CM

## 2020-01-18 DIAGNOSIS — J069 Acute upper respiratory infection, unspecified: Secondary | ICD-10-CM | POA: Diagnosis not present

## 2020-01-18 MED ORDER — AZITHROMYCIN 250 MG PO TABS: ORAL_TABLET | ORAL | 0 refills | Status: DC

## 2020-01-18 NOTE — Progress Notes (Signed)
MyChart Video Visit    Virtual Visit via Video Note   This visit type was conducted due to national recommendations for restrictions regarding the COVID-19 Pandemic (e.g. social distancing) in an effort to limit this patient's exposure and mitigate transmission in our community. This patient is at least at moderate risk for complications without adequate follow up. This format is felt to be most appropriate for this patient at this time. Physical exam was limited by quality of the video and audio technology used for the visit.   Patient location: Home  Provider location: Office  I discussed the limitations of evaluation and management by telemedicine and the availability of in person appointments. The patient expressed understanding and agreed to proceed.  Patient: Brianna Villarreal   DOB: Jul 01, 1990   29 y.o. Female  MRN: [id number] Visit Date: 01/18/2020  Today's healthcare provider: Megan Mans, MD   Chief Complaint  Patient presents with  . Cough   Subjective    Cough This is a new problem. The current episode started yesterday. The problem has been gradually improving. The cough is productive of sputum (yellow sputum). Associated symptoms include chills, a fever (up to 102), headaches, myalgias, nasal congestion, postnasal drip, rhinorrhea and sweats. Pertinent negatives include no chest pain, ear congestion, ear pain, hemoptysis, sore throat, shortness of breath or wheezing. Treatments tried: Alka Seltzer, NyQuil and OTC pain medication. The treatment provided mild relief.    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 She is not vaccinated against COVID. She does have a 43-year-old at home. 2 days ago she developed headache followed by cough with no shortness of breath. Since then she has developed bilateral maxillary pain and a temperature of 102 yesterday with some chills but no myalgias.      Medications: Outpatient Medications Prior to Visit  Medication Sig  . naproxen (NAPROSYN) 500 MG tablet Take 1 tablet (500 mg total) by mouth 2 (two) times daily with a meal.  . traZODone (DESYREL) 50 MG tablet Take 0.5-1 tablets (25-50 mg total) by mouth at bedtime as needed for sleep.  Marland Kitchen azelastine (OPTIVAR) 0.05 % ophthalmic solution Place 1 drop into both eyes 2 (two) times daily. (Patient not taking: No sig reported)  . fluticasone (FLONASE SENSIMIST) 27.5 MCG/SPRAY nasal spray Place 2 sprays into the nose daily. (Patient not taking: Reported on 01/18/2020)   No facility-administered medications prior to visit.    Review of Systems  Constitutional: Positive for chills and fever (up to 102).  HENT: Positive for postnasal drip, rhinorrhea, sinus pressure and sinus pain. Negative for ear pain and sore throat.   Respiratory: Positive for cough. Negative for hemoptysis, shortness of breath and wheezing.   Cardiovascular: Negative for chest pain.  Musculoskeletal: Positive for myalgias.  Neurological: Positive for headaches.       Objective    There were no vitals taken for this visit.    Physical Exam  She is a pleasant alert lady in no acute distress. She coughs some but this is intermittent. She is in no respiratory distress and has no obvious wheezing. She is able to speak in complete sentences.   Assessment & Plan     1. Cough Try Robitussin twice a day or Robitussin-DM twice a day of the cough is becoming bothersome to her. She states she is able to sleep at night so no narcotic cough medicine necessary. I do not think an inhaler  would help her at this time. - COVID-19, Flu A+B and RSV  2. Viral upper respiratory tract infection Check for COVID influenza and RSV. Recommend vitamin C vitamin D zinc and extra fluids daily - COVID-19, Flu A+B and RSV  3. Acute non-recurrent maxillary sinusitis Z-Pak if the COVID and influenza are negative. - azithromycin (ZITHROMAX) 250  MG tablet; UAD  Dispense: 6 tablet; Refill: 0   No follow-ups on file.     I discussed the assessment and treatment plan with the patient. The patient was provided an opportunity to ask questions and all were answered. The patient agreed with the plan and demonstrated an understanding of the instructions.   The patient was advised to call back or seek an in-person evaluation if the symptoms worsen or if the condition fails to improve as anticipated.  I provided 14  minutes of non-face-to-face time during this encounter.  I, Megan Mans, MD, have reviewed all documentation for this visit. The documentation on 01/18/20 for the exam, diagnosis, procedures, and orders are all accurate and complete.   Richard Wendelyn Breslow, MD [facility] [phone] (phone) [phone] (fax)  [facility]

## 2020-01-20 LAB — COVID-19, FLU A+B AND RSV
Influenza A, NAA: NOT DETECTED
Influenza B, NAA: NOT DETECTED
RSV, NAA: NOT DETECTED
SARS-CoV-2, NAA: DETECTED — AB

## 2020-01-20 LAB — SPECIMEN STATUS REPORT

## 2020-01-29 ENCOUNTER — Ambulatory Visit: Payer: Self-pay | Admitting: Physician Assistant

## 2020-02-07 ENCOUNTER — Ambulatory Visit: Payer: Self-pay | Admitting: Physician Assistant

## 2020-02-22 ENCOUNTER — Ambulatory Visit: Payer: Managed Care, Other (non HMO) | Admitting: Physician Assistant

## 2020-02-22 ENCOUNTER — Other Ambulatory Visit: Payer: Self-pay

## 2020-02-22 DIAGNOSIS — Z30013 Encounter for initial prescription of injectable contraceptive: Secondary | ICD-10-CM

## 2020-02-22 DIAGNOSIS — Z3202 Encounter for pregnancy test, result negative: Secondary | ICD-10-CM

## 2020-02-22 DIAGNOSIS — Z3042 Encounter for surveillance of injectable contraceptive: Secondary | ICD-10-CM

## 2020-02-22 LAB — POCT URINE PREGNANCY: Preg Test, Ur: NEGATIVE

## 2020-02-22 MED ORDER — MEDROXYPROGESTERONE ACETATE 150 MG/ML IM SUSP
150.0000 mg | Freq: Once | INTRAMUSCULAR | Status: AC
  Administered 2020-02-22: 150 mg via INTRAMUSCULAR

## 2020-02-22 NOTE — Progress Notes (Signed)
Patient was here for depo provera injection. Had been delayed in getting due to covid. Pregnancy test was negative. Depo given without issue. Return in 3 months.

## 2020-02-24 ENCOUNTER — Other Ambulatory Visit: Payer: Self-pay | Admitting: Physician Assistant

## 2020-02-24 DIAGNOSIS — F5101 Primary insomnia: Secondary | ICD-10-CM

## 2020-02-24 NOTE — Telephone Encounter (Signed)
Requested medications are due for refill today yes  Requested medications are on the active medication list yes  Last refill 1/24  Last visit 06/2019  Future visit scheduled 05/2020  Notes to clinic Failed protocol due to no valid visit within 6  months, has appt but not until May, please assess.

## 2020-03-16 ENCOUNTER — Other Ambulatory Visit: Payer: Self-pay | Admitting: Physician Assistant

## 2020-03-16 DIAGNOSIS — M222X1 Patellofemoral disorders, right knee: Secondary | ICD-10-CM

## 2020-04-05 ENCOUNTER — Encounter: Payer: Self-pay | Admitting: Family Medicine

## 2020-04-08 MED ORDER — ALBUTEROL SULFATE HFA 108 (90 BASE) MCG/ACT IN AERS: 2.0000 | INHALATION_SPRAY | Freq: Four times a day (QID) | RESPIRATORY_TRACT | 0 refills | Status: DC | PRN

## 2020-05-10 ENCOUNTER — Ambulatory Visit: Payer: Managed Care, Other (non HMO) | Admitting: Adult Health

## 2020-05-16 ENCOUNTER — Other Ambulatory Visit: Payer: Self-pay

## 2020-05-16 ENCOUNTER — Ambulatory Visit: Payer: 59 | Admitting: Family Medicine

## 2020-05-16 ENCOUNTER — Encounter: Payer: Self-pay | Admitting: Family Medicine

## 2020-05-16 DIAGNOSIS — Z3042 Encounter for surveillance of injectable contraceptive: Secondary | ICD-10-CM | POA: Diagnosis not present

## 2020-05-16 MED ORDER — MEDROXYPROGESTERONE ACETATE 150 MG/ML IM SUSP
150.0000 mg | Freq: Once | INTRAMUSCULAR | Status: AC
  Administered 2020-05-16: 150 mg via INTRAMUSCULAR

## 2020-05-16 NOTE — Progress Notes (Signed)
      Established patient visit   Patient: Brianna Villarreal   DOB: 10/18/1990   30 y.o. Female  MRN: [id number] Visit Date: 05/16/2020  Today's healthcare provider: Shirlee Latch, MD   Chief Complaint  Patient presents with  . Nurse Visit   Subjective    HPI  Patient presents in office today for nurse visit to receive her Depo-Provera injection.  Date last pap: 06/07/19 Last Depo-Provera: 02/22/20. Side Effects if any: none indicated. Serum HCG indicated? No. Depo-Provera 150 mg IM given by: Nicholos Johns.W ,NCMA. Next appointment due 08/01/2020.    I did not examine the patient.  I did review her medical history, medications, and allergies and vaccine consent form.  CMA gave injection. Patient tolerated well.  Erasmo Downer, MD, MPH [facility] 05/16/2020 1:47 PM

## 2020-06-18 ENCOUNTER — Other Ambulatory Visit: Payer: Self-pay | Admitting: Physician Assistant

## 2020-06-18 DIAGNOSIS — M222X1 Patellofemoral disorders, right knee: Secondary | ICD-10-CM

## 2020-07-30 ENCOUNTER — Telehealth: Payer: Self-pay

## 2020-07-30 NOTE — Telephone Encounter (Unsigned)
Copied from CRM [id number]. Topic: Appointment Scheduling - Scheduling Inquiry for Clinic >> Jul 30, 2020  1:39 PM Pawlus, Brianna Villarreal wrote: Reason for CRM: Pt needs to reschedule her appt from 7/28 for her depo shot. Please advise if the pt can see any provider for this.

## 2020-08-01 ENCOUNTER — Ambulatory Visit: Payer: Self-pay | Admitting: Family Medicine

## 2020-08-08 ENCOUNTER — Ambulatory Visit (INDEPENDENT_AMBULATORY_CARE_PROVIDER_SITE_OTHER): Payer: 59 | Admitting: Family Medicine

## 2020-08-08 ENCOUNTER — Encounter: Payer: Self-pay | Admitting: Family Medicine

## 2020-08-08 ENCOUNTER — Other Ambulatory Visit: Payer: Self-pay

## 2020-08-08 DIAGNOSIS — Z3042 Encounter for surveillance of injectable contraceptive: Secondary | ICD-10-CM

## 2020-08-08 MED ORDER — MEDROXYPROGESTERONE ACETATE 150 MG/ML IM SUSP
150.0000 mg | Freq: Once | INTRAMUSCULAR | Status: AC
  Administered 2020-08-08: 150 mg via INTRAMUSCULAR

## 2020-08-08 NOTE — Progress Notes (Signed)
Patient here for Depo-provera shot only.  I did not examine the patient.  I did review her medical history, medications, and allergies.  CMA gave shot. Patient tolerated well.  Erasmo Downer, MD, MPH [facility] 08/08/2020 4:39 PM

## 2020-11-01 ENCOUNTER — Ambulatory Visit: Payer: 59 | Admitting: Family Medicine

## 2020-11-15 ENCOUNTER — Other Ambulatory Visit: Payer: Self-pay

## 2020-11-15 ENCOUNTER — Ambulatory Visit (INDEPENDENT_AMBULATORY_CARE_PROVIDER_SITE_OTHER): Payer: Managed Care, Other (non HMO) | Admitting: Family Medicine

## 2020-11-15 ENCOUNTER — Encounter: Payer: Self-pay | Admitting: Family Medicine

## 2020-11-15 VITALS — BP 136/80 | HR 84 | Temp 97.7°F | Resp 16 | Ht 68.0 in | Wt 330.0 lb

## 2020-11-15 DIAGNOSIS — Z23 Encounter for immunization: Secondary | ICD-10-CM | POA: Diagnosis not present

## 2020-11-15 DIAGNOSIS — Z3009 Encounter for other general counseling and advice on contraception: Secondary | ICD-10-CM

## 2020-11-15 MED ORDER — NORETHINDRONE ACET-ETHINYL EST 1.5-30 MG-MCG PO TABS: 1.0000 | ORAL_TABLET | Freq: Every day | ORAL | 11 refills | Status: DC

## 2020-11-15 NOTE — Progress Notes (Signed)
Established patient visit   Patient: Brianna Villarreal   DOB: [birth year]   30 y.o. Female  MRN: [id number] Visit Date: 11/15/2020  Today's healthcare provider: Shirlee Latch, MD   Chief Complaint  Patient presents with   Contraception   Subjective    HPI  Patient here today for contraception counseling. Patient started Depo-Provera injections on 08/25/2019. Patient C/O menstrual cycles on and off every week. Patient reports fatigue, abdominal cramps and lower back pain.    In the beginning, she had heavy bleeding continuously.  She had gotten regular, but now having more irregular bleeding and dysmenorrhea. Tried OCPs in the past with mood swings and irregular bleeding and cramping.  Has never had a LARC. Her sister and cousin had IUDs and "it didn't work out great for them"      Medications: Outpatient Medications Prior to Visit  Medication Sig   albuterol (VENTOLIN HFA) 108 (90 Base) MCG/ACT inhaler Inhale 2 puffs into the lungs every 6 (six) hours as needed for wheezing or shortness of breath.   MOUNJARO 2.5 MG/0.5ML Pen SMARTSIG:2.5 Milligram(s) SUB-Q Once a Week   naproxen (NAPROSYN) 500 MG tablet TAKE 1 TABLET BY MOUTH TWICE DAILY WITH MEALS   traZODone (DESYREL) 50 MG tablet TAKE 1/2 TO 1 TABLET(25 TO 50 MG) BY MOUTH AT BEDTIME AS NEEDED FOR SLEEP   No facility-administered medications prior to visit.    Review of Systems  Constitutional:  Positive for fatigue.  Respiratory:  Negative for shortness of breath.   Cardiovascular:  Positive for leg swelling. Negative for chest pain and palpitations.  Gastrointestinal:  Negative for abdominal pain, nausea and vomiting.  Genitourinary:  Positive for flank pain and menstrual problem.   Last CBC Lab Results  Component Value Date   WBC 8.8 05/11/2019   HGB 12.1 05/11/2019   HCT 37.3 05/11/2019   MCV 67 (L) 05/11/2019   MCH 21.8 (L) 05/11/2019   RDW 17.3 (H) 05/11/2019   PLT 329 05/11/2019   Last metabolic  panel Lab Results  Component Value Date   GLUCOSE 80 05/11/2019   NA 140 05/11/2019   K 4.2 05/11/2019   CL 103 05/11/2019   CO2 21 05/11/2019   BUN 8 05/11/2019   CREATININE 0.79 05/11/2019   GFRNONAA 101 05/11/2019   CALCIUM 9.3 05/11/2019   PROT 7.3 05/11/2019   ALBUMIN 4.1 05/11/2019   LABGLOB 3.2 05/11/2019   AGRATIO 1.3 05/11/2019   BILITOT 0.2 05/11/2019   ALKPHOS 83 05/11/2019   AST 22 05/11/2019   ALT 11 05/11/2019   ANIONGAP 6 08/08/2015   Last lipids Lab Results  Component Value Date   CHOL 172 05/11/2019   HDL 40 05/11/2019   LDLCALC 115 (H) 05/11/2019   TRIG 91 05/11/2019   CHOLHDL 4.3 05/11/2019   Last hemoglobin A1c Lab Results  Component Value Date   HGBA1C 6.3 (H) 05/11/2019   Last thyroid functions Lab Results  Component Value Date   TSH 1.510 05/11/2019       Objective    BP 136/80 (BP Location: Right Arm, Patient Position: Sitting, Cuff Size: Large)   Pulse 84   Temp 97.7 F (36.5 C) (Temporal)   Resp 16   Ht 5\' 8"  (1.727 m)   Wt (!) 330 lb (149.7 kg)   LMP 11/10/2020 (Exact Date)   SpO2 97%   BMI 50.18 kg/m  BP Readings from Last 3 Encounters:  11/15/20 136/80  08/25/19 118/90  06/07/19 135/84  Wt Readings from Last 3 Encounters:  11/15/20 (!) 330 lb (149.7 kg)  08/25/19 (!) 332 lb 8 oz (150.8 kg)  06/07/19 (!) 331 lb (150.1 kg)      Physical Exam Vitals reviewed.  Constitutional:      General: She is not in acute distress.    Appearance: Normal appearance. She is well-developed. She is not diaphoretic.  HENT:     Head: Normocephalic and atraumatic.  Eyes:     General: No scleral icterus.    Conjunctiva/sclera: Conjunctivae normal.  Neck:     Thyroid: No thyromegaly.  Cardiovascular:     Rate and Rhythm: Normal rate and regular rhythm.     Pulses: Normal pulses.     Heart sounds: Normal heart sounds. No murmur heard. Pulmonary:     Effort: Pulmonary effort is normal. No respiratory distress.     Breath sounds:  Normal breath sounds. No wheezing, rhonchi or rales.  Musculoskeletal:     Cervical back: Neck supple.     Right lower leg: No edema.     Left lower leg: No edema.  Lymphadenopathy:     Cervical: No cervical adenopathy.  Skin:    General: Skin is warm and dry.     Coloration: Skin is not jaundiced.     Findings: No rash.  Neurological:     Mental Status: She is alert and oriented to person, place, and time. Mental status is at baseline.  Psychiatric:        Mood and Affect: Mood normal.        Behavior: Behavior normal.      No results found for any visits on 11/15/20.  Assessment & Plan     Problem List Items Addressed This Visit   None Visit Diagnoses     Encounter for counseling regarding contraception    -  Primary      Not tolerating Depo Will try higher dose of Junel than she previosuly took Not interested in LARCs Will f/u in 52m and see how she is doing - consider alternative if not tolerating OCPs well at that time  Meds ordered this encounter  Medications   Norethindrone Acetate-Ethinyl Estradiol (JUNEL 1.5/30) 1.5-30 MG-MCG tablet    Sig: Take 1 tablet by mouth daily.    Dispense:  28 tablet    Refill:  11     Return in about 3 months (around 02/15/2021) for chronic disease f/u, With new PCP.      I, Shirlee Latch, MD, have reviewed all documentation for this visit. The documentation on 11/15/20 for the exam, diagnosis, procedures, and orders are all accurate and complete.   Brissia Delisa, Marzella Schlein, MD, MPH [facility]

## 2020-11-19 ENCOUNTER — Ambulatory Visit
Admission: EM | Admit: 2020-11-19 | Discharge: 2020-11-19 | Disposition: A | Discharge status: Home / Self Care | Payer: Managed Care, Other (non HMO) | Attending: Emergency Medicine | Admitting: Emergency Medicine

## 2020-11-19 ENCOUNTER — Encounter: Payer: Self-pay | Admitting: Emergency Medicine

## 2020-11-19 ENCOUNTER — Other Ambulatory Visit: Payer: Self-pay

## 2020-11-19 DIAGNOSIS — J101 Influenza due to other identified influenza virus with other respiratory manifestations: Secondary | ICD-10-CM

## 2020-11-19 LAB — POCT INFLUENZA A/B
Influenza A, POC: POSITIVE — AB
Influenza B, POC: NEGATIVE

## 2020-11-19 MED ORDER — OSELTAMIVIR PHOSPHATE 75 MG PO CAPS: 75.0000 mg | ORAL_CAPSULE | Freq: Two times a day (BID) | ORAL | 0 refills | Status: DC

## 2020-11-19 NOTE — ED Triage Notes (Signed)
Pt here with flu-like sx x yesterday.

## 2020-11-19 NOTE — ED Provider Notes (Signed)
Renaldo Fiddler    CSN: [id number] Arrival date & time: 11/19/20  1442      History   Chief Complaint Chief Complaint  Patient presents with   Cough   Fever   Generalized Body Aches    HPI Brianna Villarreal is a 30 y.o. female.  Presents with 1 day history of fever, headache, body aches, congestion, cough.  Treatment at home with Tylenol.  She denies rash, shortness of breath, vomiting, diarrhea, or other symptoms.  No pertinent medical history.  The history is provided by the patient.   Past Medical History:  Diagnosis Date   GERD (gastroesophageal reflux disease)     Patient Active Problem List   Diagnosis Date Noted   GERD (gastroesophageal reflux disease) 05/28/2015   Allergic rhinitis 08/17/2014    Past Surgical History:  Procedure Laterality Date   NO PAST SURGERIES      OB History   No obstetric history on file.      Home Medications    Prior to Admission medications   Medication Sig Start Date End Date Taking? Authorizing Provider  oseltamivir (TAMIFLU) 75 MG capsule Take 1 capsule (75 mg total) by mouth every 12 (twelve) hours. 11/19/20  Yes Mickie Bail, NP  albuterol (VENTOLIN HFA) 108 (90 Base) MCG/ACT inhaler Inhale 2 puffs into the lungs every 6 (six) hours as needed for wheezing or shortness of breath. 04/08/20   Margaretann Loveless, [name]  MOUNJARO 2.5 MG/0.5ML Pen SMARTSIG:2.5 Milligram(s) SUB-Q Once a Week 11/05/20   [provider]  naproxen (NAPROSYN) 500 MG tablet TAKE 1 TABLET BY MOUTH TWICE DAILY WITH MEALS 06/27/20   Erasmo Downer, MD  Norethindrone Acetate-Ethinyl Estradiol (JUNEL 1.5/30) 1.5-30 MG-MCG tablet Take 1 tablet by mouth daily. 11/15/20   Erasmo Downer, MD  traZODone (DESYREL) 50 MG tablet TAKE 1/2 TO 1 TABLET(25 TO 50 MG) BY MOUTH AT BEDTIME AS NEEDED FOR SLEEP 02/26/20   Margaretann Loveless, [name]    Family History Family History  Problem Relation Age of Onset   Hypertension Mother     Social  History Social History   Tobacco Use   Smoking status: Never   Smokeless tobacco: Never  Substance Use Topics   Alcohol use: Yes    Alcohol/week: 0.0 standard drinks    Comment: occasional   Drug use: No     Allergies   Latex   Review of Systems Review of Systems  Constitutional:  Positive for fever. Negative for chills.  HENT:  Positive for congestion. Negative for ear pain and sore throat.   Respiratory:  Positive for cough. Negative for shortness of breath.   Cardiovascular:  Negative for chest pain and palpitations.  Gastrointestinal:  Negative for diarrhea and vomiting.  Skin:  Negative for color change and rash.  All other systems reviewed and are negative.   Physical Exam Triage Vital Signs ED Triage Vitals [11/19/20 1633]  Enc Vitals Group     BP 139/77     Pulse Rate (!) 103     Resp 20     Temp 99.5 F (37.5 C)     Temp src      SpO2 96 %     Weight      Height      Head Circumference      Peak Flow      Pain Score      Pain Loc      Pain Edu?  Excl. in GC?    No data found.  Updated Vital Signs BP 139/77   Pulse (!) 103   Temp 99.5 F (37.5 C)   Resp 20   LMP 11/10/2020 (Exact Date)   SpO2 96%   Visual Acuity Right Eye Distance:   Left Eye Distance:   Bilateral Distance:    Right Eye Near:   Left Eye Near:    Bilateral Near:     Physical Exam Vitals and nursing note reviewed.  Constitutional:      General: She is not in acute distress.    Appearance: She is well-developed. She is obese.  HENT:     Head: Normocephalic and atraumatic.     Right Ear: Tympanic membrane normal.     Left Ear: Tympanic membrane normal.     Nose: Nose normal.     Mouth/Throat:     Mouth: Mucous membranes are moist.     Pharynx: Oropharynx is clear.  Eyes:     Conjunctiva/sclera: Conjunctivae normal.  Cardiovascular:     Rate and Rhythm: Normal rate and regular rhythm.     Heart sounds: Normal heart sounds.  Pulmonary:     Effort: Pulmonary  effort is normal. No respiratory distress.     Breath sounds: Normal breath sounds.  Abdominal:     Palpations: Abdomen is soft.     Tenderness: There is no abdominal tenderness.  Musculoskeletal:     Cervical back: Neck supple.  Skin:    General: Skin is warm and dry.  Neurological:     Mental Status: She is alert.  Psychiatric:        Mood and Affect: Mood normal.        Behavior: Behavior normal.     UC Treatments / Results  Labs (all labs ordered are listed, but only abnormal results are displayed) Labs Reviewed  POCT INFLUENZA A/B    EKG   Radiology No results found.  Procedures Procedures (including critical care time)  Medications Ordered in UC Medications - No data to display  Initial Impression / Assessment and Plan / UC Course  I have reviewed the triage vital signs and the nursing notes.  Pertinent labs & imaging results that were available during my care of the patient were reviewed by me and considered in my medical decision making (see chart for details).  Influenza A.  Rapid flu test positive for influenza A.  Treating with Tamiflu.  Discussed symptomatic treatment including Tylenol or ibuprofen as needed.  Education provided on influenza.  Instructed patient to follow-up with PCP if her symptoms are not improving.  She agrees to plan of care.    Final Clinical Impressions(s) / UC Diagnoses   Final diagnoses:  Influenza A     Discharge Instructions      Take the Tamiflu as directed.  Take Tylenol or ibuprofen as needed for fever or discomfort.  Follow-up with your primary care provider if your symptoms are not improving.      ED Prescriptions     Medication Sig Dispense Auth. Provider   oseltamivir (TAMIFLU) 75 MG capsule Take 1 capsule (75 mg total) by mouth every 12 (twelve) hours. 10 capsule Mickie Bail, NP      PDMP not reviewed this encounter.   Mickie Bail, NP 11/19/20 [phone]

## 2020-11-19 NOTE — Discharge Instructions (Addendum)
Take the Tamiflu as directed.  Take Tylenol or ibuprofen as needed for fever or discomfort.  Follow-up with your primary care provider if your symptoms are not improving.  

## 2020-11-21 ENCOUNTER — Ambulatory Visit: Payer: Self-pay | Admitting: Family Medicine

## 2021-02-20 NOTE — Progress Notes (Signed)
I,[name],acting as a Neurosurgeon for [employer], [name].,have documented all relevant documentation on the behalf of [name], [name],as directed by  [name], [name] while in the presence of [name], [name].   Established patient visit   Patient: Brianna Villarreal   DOB: [birth year]   31 y.o. Female  MRN: [id number] Visit Date: 02/21/2021  Today's healthcare provider: [name], [name]   Cc. Chronic condition f/u  Subjective    HPI   Prediabetes, Follow-up  Lab Results  Component Value Date   HGBA1C 6.3 (H) 05/11/2019   HGBA1C 6.0 (H) 08/20/2016   HGBA1C 6.0 (H) 09/04/2015   GLUCOSE 80 05/11/2019   GLUCOSE 88 08/20/2016   GLUCOSE 95 09/04/2015    Previously took two months of Mounjaro at 2.5 mg and 5 mg. Had very little SE. States she was no longer able to get, couldn't use coupon any longer and PA did not go through.  She is interested in restarting or trying something different.   Insomnia --Now since switching to day-time shift, able to sleep. Takes Trazodone 1/2 tab maybe once a week.  Menorrhagia --Improved on Junel higher dose. Still states menstruation is long, 7-8 days. Heavy at first and tapers off. Much better than pre- birth control, less painful, fewer clots. More consistent than with DEPO. Still unhappy with how long her cycles are, but feels things are okay for now as they are better than they were before.  Pertinent Labs:    Component Value Date/Time   CHOL 172 05/11/2019 1007   TRIG 91 05/11/2019 1007   CHOLHDL 4.3 05/11/2019 1007   CREATININE 0.79 05/11/2019 1007    Wt Readings from Last 3 Encounters:  02/21/21 (!) 319 lb 3.2 oz (144.8 kg)  11/15/20 (!) 330 lb (149.7 kg)  08/25/19 (!) 332 lb 8 oz (150.8 kg)    -----------------------------------------------------------------------------------------  Medications: Outpatient Medications Prior to Visit  Medication Sig   albuterol (VENTOLIN HFA) 108 (90 Base) MCG/ACT  inhaler Inhale 2 puffs into the lungs every 6 (six) hours as needed for wheezing or shortness of breath.   Norethindrone Acetate-Ethinyl Estradiol (JUNEL 1.5/30) 1.5-30 MG-MCG tablet Take 1 tablet by mouth daily.   traZODone (DESYREL) 50 MG tablet TAKE 1/2 TO 1 TABLET(25 TO 50 MG) BY MOUTH AT BEDTIME AS NEEDED FOR SLEEP   [DISCONTINUED] naproxen (NAPROSYN) 500 MG tablet TAKE 1 TABLET BY MOUTH TWICE DAILY WITH MEALS (Patient taking differently: Take by mouth as needed. With meals)   [DISCONTINUED] MOUNJARO 2.5 MG/0.5ML Pen SMARTSIG:2.5 Milligram(s) SUB-Q Once a Week (Patient not taking: Reported on 02/21/2021)   [DISCONTINUED] oseltamivir (TAMIFLU) 75 MG capsule Take 1 capsule (75 mg total) by mouth every 12 (twelve) hours.   No facility-administered medications prior to visit.    Review of Systems  Constitutional:  Negative for fatigue and fever.  Respiratory:  Negative for cough and shortness of breath.   Cardiovascular:  Negative for chest pain and leg swelling.  Gastrointestinal:  Negative for abdominal pain.  Genitourinary:  Positive for menstrual problem.  Neurological:  Negative for dizziness and headaches.     Objective    Blood pressure 136/81, pulse 86, height 5\' 8"  (1.727 m), weight (!) 319 lb 3.2 oz (144.8 kg), SpO2 100 %.   Physical Exam Constitutional:      General: She is awake.     Appearance: She is well-developed.  HENT:     Head: Normocephalic.  Eyes:     Conjunctiva/sclera: Conjunctivae normal.  Cardiovascular:     Rate and Rhythm: Normal rate and regular rhythm.     Heart sounds: Normal heart sounds.  Pulmonary:     Effort: Pulmonary effort is normal.     Breath sounds: Normal breath sounds.  Skin:    General: Skin is warm.  Neurological:     Mental Status: She is alert and oriented to person, place, and time.  Psychiatric:        Attention and Perception: Attention normal.        Mood and Affect: Mood normal.        Speech: Speech normal.         Behavior: Behavior is cooperative.     No results found for any visits on 02/21/21.  Assessment & Plan     Problem List Items Addressed This Visit       Other   Prediabetes - Primary    Discussed Mounjaro vs Wegovy.  I think d/t her BMI but lack of other co-morbidities, we will have better luck with Wegovy rx. Ordered some labs before to check A1c, lipids, if her A1c is > 6.4% she may qualify for Mounjaro.  Discussed diet, exercise, importance of lifestyle changes when taking weight loss/diabetes meds      Menorrhagia with regular cycle    For now, continue Junel. If cramping worsens or bleeding is heavier, we can pursue a pelvic ultrasound to see if there is a structural (fibroids, etc) component to her bleeding and pain.  Discussed other birth control/hormonal options, I think Junel is an appropriate balance for her      Other Visit Diagnoses     Morbid obesity (HCC)       Relevant Orders   HgB A1c   Comprehensive Metabolic Panel (CMET)   Lipid Profile   CBC   TSH + free T4   Encounter for hepatitis C screening test for low risk patient       Relevant Orders   Hepatitis C antibody   Moderate mixed hyperlipidemia not requiring statin therapy       Relevant Orders   Comprehensive Metabolic Panel (CMET)   Lipid Profile        Return in about 3 months (around 05/21/2021) for weight Management.      I, [name], [name] have reviewed all documentation for this visit. The documentation on  02/21/2021 for the exam, diagnosis, procedures, and orders are all accurate and complete.    [name], [name]  [facility] [phone] (phone) [phone] (fax)  [facility]

## 2021-02-21 ENCOUNTER — Ambulatory Visit (INDEPENDENT_AMBULATORY_CARE_PROVIDER_SITE_OTHER): Payer: Managed Care, Other (non HMO) | Admitting: Physician Assistant

## 2021-02-21 ENCOUNTER — Other Ambulatory Visit: Payer: Self-pay | Admitting: Physician Assistant

## 2021-02-21 ENCOUNTER — Other Ambulatory Visit: Payer: Self-pay

## 2021-02-21 ENCOUNTER — Encounter: Payer: Self-pay | Admitting: Physician Assistant

## 2021-02-21 VITALS — BP 136/81 | HR 86 | Ht 68.0 in | Wt 319.2 lb

## 2021-02-21 DIAGNOSIS — Z1159 Encounter for screening for other viral diseases: Secondary | ICD-10-CM | POA: Diagnosis not present

## 2021-02-21 DIAGNOSIS — N920 Excessive and frequent menstruation with regular cycle: Secondary | ICD-10-CM | POA: Diagnosis not present

## 2021-02-21 DIAGNOSIS — R7303 Prediabetes: Secondary | ICD-10-CM | POA: Diagnosis not present

## 2021-02-21 DIAGNOSIS — E782 Mixed hyperlipidemia: Secondary | ICD-10-CM

## 2021-02-21 LAB — LIPID PANEL

## 2021-02-21 NOTE — Assessment & Plan Note (Signed)
For now, continue Junel. If cramping worsens or bleeding is heavier, we can pursue a pelvic ultrasound to see if there is a structural (fibroids, etc) component to her bleeding and pain.  Discussed other birth control/hormonal options, I think Junel is an appropriate balance for her

## 2021-02-21 NOTE — Assessment & Plan Note (Signed)
Discussed Mounjaro vs Wegovy.  I think d/t her BMI but lack of other co-morbidities, we will have better luck with Wegovy rx. Ordered some labs before to check A1c, lipids, if her A1c is > 6.4% she may qualify for Mounjaro.  Discussed diet, exercise, importance of lifestyle changes when taking weight loss/diabetes meds

## 2021-02-22 LAB — CBC
Hematocrit: 39.9 % (ref 34.0–46.6)
Hemoglobin: 12.4 g/dL (ref 11.1–15.9)
MCH: 21.7 pg — ABNORMAL LOW (ref 26.6–33.0)
MCHC: 31.1 g/dL — ABNORMAL LOW (ref 31.5–35.7)
MCV: 70 fL — ABNORMAL LOW (ref 79–97)
Platelets: 349 10*3/uL (ref 150–450)
RBC: 5.71 x10E6/uL — ABNORMAL HIGH (ref 3.77–5.28)
RDW: 16.7 % — ABNORMAL HIGH (ref 11.7–15.4)
WBC: 9.7 10*3/uL (ref 3.4–10.8)

## 2021-02-22 LAB — COMPREHENSIVE METABOLIC PANEL
ALT: 12 IU/L (ref 0–32)
AST: 17 IU/L (ref 0–40)
Albumin/Globulin Ratio: 1.5 (ref 1.2–2.2)
Albumin: 4.2 g/dL (ref 3.8–4.8)
Alkaline Phosphatase: 91 IU/L (ref 44–121)
BUN/Creatinine Ratio: 12 (ref 9–23)
BUN: 10 mg/dL (ref 6–20)
Bilirubin Total: 0.2 mg/dL (ref 0.0–1.2)
CO2: 22 mmol/L (ref 20–29)
Calcium: 9.3 mg/dL (ref 8.7–10.2)
Chloride: 104 mmol/L (ref 96–106)
Creatinine, Ser: 0.86 mg/dL (ref 0.57–1.00)
Globulin, Total: 2.8 g/dL (ref 1.5–4.5)
Glucose: 99 mg/dL (ref 70–99)
Potassium: 4.8 mmol/L (ref 3.5–5.2)
Sodium: 139 mmol/L (ref 134–144)
Total Protein: 7 g/dL (ref 6.0–8.5)
eGFR: 93 mL/min/{1.73_m2} (ref >59)

## 2021-02-22 LAB — LIPID PANEL
Chol/HDL Ratio: 3.7 ratio (ref 0.0–4.4)
Cholesterol, Total: 175 mg/dL (ref 100–199)
HDL: 47 mg/dL (ref >39)
LDL Chol Calc (NIH): 117 mg/dL — ABNORMAL HIGH (ref 0–99)
Triglycerides: 58 mg/dL (ref 0–149)
VLDL Cholesterol Cal: 11 mg/dL (ref 5–40)

## 2021-02-22 LAB — TSH+FREE T4
Free T4: 1.14 ng/dL (ref 0.82–1.77)
TSH: 1.18 u[IU]/mL (ref 0.450–4.500)

## 2021-02-22 LAB — HEMOGLOBIN A1C
Est. average glucose Bld gHb Est-mCnc: 131 mg/dL
Hgb A1c MFr Bld: 6.2 % — ABNORMAL HIGH (ref 4.8–5.6)

## 2021-02-22 LAB — HEPATITIS C ANTIBODY: Hep C Virus Ab: NONREACTIVE

## 2021-02-25 ENCOUNTER — Other Ambulatory Visit: Payer: Self-pay | Admitting: Physician Assistant

## 2021-02-25 MED ORDER — WEGOVY 0.25 MG/0.5ML ~~LOC~~ SOAJ: 0.2500 mg | SUBCUTANEOUS | 0 refills | Status: DC

## 2021-02-25 MED ORDER — WEGOVY 0.5 MG/0.5ML ~~LOC~~ SOAJ: 0.5000 mg | SUBCUTANEOUS | 1 refills | Status: DC

## 2021-02-27 ENCOUNTER — Other Ambulatory Visit: Payer: Self-pay | Admitting: Physician Assistant

## 2021-02-27 MED ORDER — WEGOVY 0.25 MG/0.5ML ~~LOC~~ SOAJ: 0.2500 mg | SUBCUTANEOUS | 0 refills | Status: DC

## 2021-02-27 NOTE — Progress Notes (Signed)
Resending order to pharm as they requested a sig change to '2 each' I dont know what that means, the dose for a months worth is 38ml total of medicaiton, wegovy comes in singular pens of 0.5 ml each, she needs 4 pens, which is 1 box.  Unsure what the 2 each represents. Sent note to pharm

## 2021-03-11 ENCOUNTER — Other Ambulatory Visit: Payer: Self-pay | Admitting: Physician Assistant

## 2021-03-12 MED ORDER — ALBUTEROL SULFATE HFA 108 (90 BASE) MCG/ACT IN AERS: 2.0000 | INHALATION_SPRAY | Freq: Four times a day (QID) | RESPIRATORY_TRACT | 0 refills | Status: AC | PRN

## 2021-03-13 ENCOUNTER — Other Ambulatory Visit: Payer: Self-pay

## 2021-03-13 ENCOUNTER — Ambulatory Visit
Admission: RE | Admit: 2021-03-13 | Discharge: 2021-03-13 | Disposition: A | Discharge status: Home / Self Care | Payer: Managed Care, Other (non HMO) | Source: Ambulatory Visit | Attending: Family Medicine | Admitting: Family Medicine

## 2021-03-13 VITALS — BP 138/91 | HR 91 | Temp 98.2°F | Resp 18

## 2021-03-13 DIAGNOSIS — J309 Allergic rhinitis, unspecified: Secondary | ICD-10-CM

## 2021-03-13 MED ORDER — IPRATROPIUM BROMIDE 0.03 % NA SOLN: 2.0000 | Freq: Three times a day (TID) | NASAL | 0 refills | Status: DC | PRN

## 2021-03-13 MED ORDER — LEVOCETIRIZINE DIHYDROCHLORIDE 5 MG PO TABS: 5.0000 mg | ORAL_TABLET | Freq: Every evening | ORAL | 0 refills | Status: DC

## 2021-03-13 NOTE — ED Triage Notes (Signed)
Pt c/o stuffy nose, bilateral ears itching, sinus pressure, cough and watery eyes x 4 days  ?

## 2021-03-13 NOTE — ED Provider Notes (Signed)
?[facility] ? ? ? ?CSN: [id number] ?Arrival date & time: 03/13/21  9166 ? ? ?  ? ?History   ?Chief Complaint ?No chief complaint on file. ? ? ?HPI ?Brianna Villarreal is a 31 y.o. female.  ? ?HPI ?Patient presents for evaluation of nasal drainage, postnasal drainage, bilateral ear itching, cough, water eyes and sneezing.  Reports taking some over-the-counter medication with significant relief with Benadryl however Benadryl makes her drowsy.  Patient has a history of allergic rhinitis.  She denies any known sick contacts or any other symptoms. ?Past Medical History:  ?Diagnosis Date  ? GERD (gastroesophageal reflux disease)   ? Insomnia   ? ? ?Patient Active Problem List  ? Diagnosis Date Noted  ? Prediabetes 02/21/2021  ? Menorrhagia with regular cycle 02/21/2021  ? GERD (gastroesophageal reflux disease) 05/28/2015  ? Allergic rhinitis 08/17/2014  ? ? ?Past Surgical History:  ?Procedure Laterality Date  ? NO PAST SURGERIES    ? ? ?OB History   ?No obstetric history on file. ?  ? ? ? ?Home Medications   ? ?Prior to Admission medications   ?Medication Sig Start Date End Date Taking? Authorizing Provider  ?Semaglutide-Weight Management (WEGOVY) 0.5 MG/0.5ML SOAJ Inject 0.5 mg into the skin once a week. Do not start until after you finish the 0.25 mg weekly x 4 week injections 02/25/21   Drubel, Lillia Abed, [name]  ?albuterol (VENTOLIN HFA) 108 (90 Base) MCG/ACT inhaler Inhale 2 puffs into the lungs every 6 (six) hours as needed for wheezing or shortness of breath. 03/12/21   [name], [name]  ?Norethindrone Acetate-Ethinyl Estradiol (JUNEL 1.5/30) 1.5-30 MG-MCG tablet Take 1 tablet by mouth daily. 11/15/20   Erasmo Downer, MD  ?Semaglutide-Weight Management (WEGOVY) 0.25 MG/0.5ML SOAJ Inject 0.25 mg into the skin once a week. Start with this injection once weekly for four week and then increase to 0.5 mg dose 02/27/21   Drubel, Lillia Abed, [name]  ?traZODone (DESYREL) 50 MG tablet TAKE 1/2 TO 1 TABLET(25 TO 50 MG) BY  MOUTH AT BEDTIME AS NEEDED FOR SLEEP 02/26/20   Margaretann Loveless, [name]  ? ? ?Family History ?Family History  ?Problem Relation Age of Onset  ? Hypertension Mother   ? ? ?Social History ?Social History  ? ?Tobacco Use  ? Smoking status: Never  ? Smokeless tobacco: Never  ?Substance Use Topics  ? Alcohol use: Yes  ?  Alcohol/week: 0.0 standard drinks  ?  Comment: occasional  ? Drug use: No  ? ? ? ?Allergies   ?Latex ? ? ?Review of Systems ?Review of Systems ?Pertinent negatives listed in HPI  ? ?Physical Exam ?Triage Vital Signs ?ED Triage Vitals  ?Enc Vitals Group  ?   BP   ?   Pulse   ?   Resp   ?   Temp   ?   Temp src   ?   SpO2   ?   Weight   ?   Height   ?   Head Circumference   ?   Peak Flow   ?   Pain Score   ?   Pain Loc   ?   Pain Edu?   ?   Excl. in GC?   ? ?No data found. ? ?Updated Vital Signs ?BP (!) 138/91 (BP Location: Left Wrist)   Pulse 91   Temp 98.2 ?F (36.8 ?C) (Oral)   Resp 18   LMP 03/06/2021   SpO2 98%  ? ?Visual Acuity ?Right Eye  Distance:   ?Left Eye Distance:   ?Bilateral Distance:   ? ?Right Eye Near:   ?Left Eye Near:    ?Bilateral Near:    ? ?Physical Exam ? ? ?UC Treatments / Results  ?Labs ?(all labs ordered are listed, but only abnormal results are displayed) ?Labs Reviewed - No data to display ? ?EKG ? ? ?Radiology ?No results found. ? ?Procedures ?Procedures (including critical care time) ? ?Medications Ordered in UC ?Medications - No data to display ? ?Initial Impression / Assessment and Plan / UC Course  ?I have reviewed the triage vital signs and the nursing notes. ? ?Pertinent labs & imaging results that were available during my care of the patient were reviewed by me and considered in my medical decision making (see chart for details). ? ?  ?Allergic rhinitis ?Treatment per discharge medication orders and instructions. ?Work note provided. ?RTC PRN ?Final Clinical Impressions(s) / UC Diagnoses  ? ?Final diagnoses:  ?Allergic rhinitis, unspecified seasonality, unspecified  trigger  ? ? ? ?Discharge Instructions   ? ?  ?Purchase over the counter sudafed (long-acting or short acting) take as prescribed for nasal congestion. Take all other medications as prescribed . ? ? ? ?ED Prescriptions   ? ? Medication Sig Dispense Auth. Provider  ? levocetirizine (XYZAL) 5 MG tablet Take 1 tablet (5 mg total) by mouth every evening. 30 tablet Bing Neighbors, FNP  ? ipratropium (ATROVENT) 0.03 % nasal spray Place 2 sprays into both nostrils 3 (three) times daily as needed for rhinitis. 30 mL Bing Neighbors, FNP  ? ?  ? ?PDMP not reviewed this encounter. ?  ?Bing Neighbors, FNP ?03/13/21 1034 ? ?

## 2021-03-13 NOTE — Discharge Instructions (Signed)
Purchase over the counter sudafed (long-acting or short acting) take as prescribed for nasal congestion. Take all other medications as prescribed . ?

## 2021-03-17 ENCOUNTER — Encounter: Payer: Self-pay | Admitting: Physician Assistant

## 2021-03-19 ENCOUNTER — Telehealth: Payer: Self-pay

## 2021-03-19 NOTE — Telephone Encounter (Signed)
Copied from [place] [id number]. Topic: General - Other ?>> Mar 19, 2021  4:35 PM McGill, Nelva Bush wrote: ?Reason for [id number], from [employer] Department from [employer], stated they will be faxing forms with additional questions. In regards to medication, [facility] PA. ? ?Case - [id number] ?Call back # [phone]  ?Fax- [phone] ?

## 2021-03-20 NOTE — Telephone Encounter (Signed)
Called about the [facility] appeal and stated they are missing clinical information / fax to [phone] or you can contact appeals at [phone] ?Case# [id number] ?

## 2021-03-21 ENCOUNTER — Telehealth: Payer: Self-pay

## 2021-03-21 ENCOUNTER — Telehealth: Payer: Self-pay | Admitting: Physician Assistant

## 2021-03-21 ENCOUNTER — Other Ambulatory Visit: Payer: Self-pay | Admitting: Physician Assistant

## 2021-03-21 DIAGNOSIS — F5101 Primary insomnia: Secondary | ICD-10-CM

## 2021-03-21 MED ORDER — TRAZODONE HCL 50 MG PO TABS: ORAL_TABLET | ORAL | 1 refills | Status: AC

## 2021-03-21 NOTE — Telephone Encounter (Signed)
Copied from CRM [id number]. Topic: General - Other ?>> Mar 21, 2021  1:37 PM Gaetana Michaelis A wrote: ?Reason for CRM: Byrd Hesselbach with [employer] prior authorization department has called to share that the prior authorization for the patient's [facility] prescription has been approved  ? ?An approval letter will be faxed to the practice today 03/21/21  ? ?Please contact further if needed ?

## 2021-03-21 NOTE — Telephone Encounter (Signed)
[facility] pharmacy faxed refill request for the following medications:   traZODone (DESYREL) 50 MG tablet    Please advise  

## 2021-04-07 ENCOUNTER — Other Ambulatory Visit: Payer: Self-pay | Admitting: Family Medicine

## 2021-04-09 ENCOUNTER — Other Ambulatory Visit: Payer: Self-pay | Admitting: Family Medicine

## 2021-05-22 NOTE — Progress Notes (Signed)
Established patient visit   Patient: Brianna Villarreal   DOB: Apr 18, 1990   31 y.o. Female  MRN: [id number] Visit Date: 05/23/2021  Today's healthcare provider: Mikey Kirschner, [name]   I,Tiffany J Bragg,acting as a scribe for Mikey Kirschner, [name].,have documented all relevant documentation on the behalf of Mikey Kirschner, [name],as directed by  Mikey Kirschner, [name] while in the presence of Mikey Kirschner, [name].  Chief Complaint  Patient presents with   Diabetes   Subjective    HPI  Prediabetes, Follow-up  Lab Results  Component Value Date   HGBA1C 6.2 (H) 02/21/2021   HGBA1C 6.3 (H) 05/11/2019   HGBA1C 6.0 (H) 08/20/2016   GLUCOSE 99 02/21/2021   GLUCOSE 80 05/11/2019   GLUCOSE 88 08/20/2016    Last seen for for this 3 months ago.  Management since that visit includes starting [facility]. Due to an initial denial she only started the medication two months ago. Reports completing 4 weeks of 0.25 mg and 4 weeks of 0.5 mg. Has another box of 0.5 mg injections. Denies issues w/ injections reports nausea the day of the shot and 1-2 days after. Unsure if she is losing weight but her eating patterns have changed and her clothes fit differently.  Denies vomiting, changes in BM, abdominal pain.    She also reports consistently heavy periods w/ the Junel. She did skip a period in March, around the time she started Wegovy.   Pertinent Labs:    Component Value Date/Time   CHOL 175 02/21/2021 0926   TRIG 58 02/21/2021 0926   CHOLHDL 3.7 02/21/2021 0926   CREATININE 0.86 02/21/2021 0926    Wt Readings from Last 3 Encounters:  05/23/21 (!) 318 lb 6.4 oz (144.4 kg)  02/21/21 (!) 319 lb 3.2 oz (144.8 kg)  11/15/20 (!) 330 lb (149.7 kg)    -----------------------------------------------------------------------------------------   Medications: Outpatient Medications Prior to Visit  Medication Sig   albuterol (VENTOLIN HFA) 108 (90 Base) MCG/ACT inhaler Inhale 2 puffs into the lungs  every 6 (six) hours as needed for wheezing or shortness of breath.   ipratropium (ATROVENT) 0.03 % nasal spray Place 2 sprays into both nostrils 3 (three) times daily as needed for rhinitis.   levocetirizine (XYZAL) 5 MG tablet Take 1 tablet (5 mg total) by mouth every evening.   Norethindrone Acetate-Ethinyl Estradiol (JUNEL 1.5/30) 1.5-30 MG-MCG tablet Take 1 tablet by mouth daily.   traZODone (DESYREL) 50 MG tablet TAKE 1/2 TO 1 TABLET(25 TO 50 MG) BY MOUTH AT BEDTIME AS NEEDED FOR SLEEP   [DISCONTINUED] Semaglutide-Weight Management (WEGOVY) 0.25 MG/0.5ML SOAJ Inject 0.25 mg into the skin once a week. Start with this injection once weekly for four week and then increase to 0.5 mg dose   [DISCONTINUED] Semaglutide-Weight Management (WEGOVY) 0.5 MG/0.5ML SOAJ Inject 0.5 mg into the skin once a week. Do not start until after you finish the 0.25 mg weekly x 4 week injections   No facility-administered medications prior to visit.    Review of Systems  Constitutional:  Negative for fatigue and fever.  Respiratory:  Negative for cough and shortness of breath.   Cardiovascular:  Negative for chest pain and leg swelling.  Gastrointestinal:  Positive for nausea. Negative for abdominal pain.  Neurological:  Negative for dizziness and headaches.      Objective    BP 112/77 (BP Location: Left Arm, Patient Position: Sitting, Cuff Size: Large)   Pulse 83   Temp 98.1 F (36.7 C) (Oral)  Resp 16   Ht 5\' 8"  (1.727 m)   Wt (!) 318 lb 6.4 oz (144.4 kg)   SpO2 100%   BMI 48.41 kg/m    Physical Exam Constitutional:      General: She is awake.     Appearance: She is well-developed.  HENT:     Head: Normocephalic.  Eyes:     Conjunctiva/sclera: Conjunctivae normal.  Cardiovascular:     Rate and Rhythm: Normal rate and regular rhythm.     Heart sounds: Normal heart sounds.  Pulmonary:     Effort: Pulmonary effort is normal.     Breath sounds: Normal breath sounds.  Skin:    General: Skin  is warm.  Neurological:     Mental Status: She is alert and oriented to person, place, and time.  Psychiatric:        Attention and Perception: Attention normal.        Mood and Affect: Mood normal.        Speech: Speech normal.        Behavior: Behavior is cooperative.     No results found for any visits on 05/23/21.  Assessment & Plan     Problem List Items Addressed This Visit       Other   Prediabetes   Relevant Medications   Semaglutide-Weight Management (WEGOVY) 1 MG/0.5ML SOAJ   Menorrhagia with regular cycle    Discussed other birth control/hormonal options, could switch to progesterone-only pill We will start with trying continuous OCP -- skipping placebo week. If this does not control her cycles will proceed w/ pelvic ultrasound and consider switching OCP       Morbid obesity ([place]) - Primary    Increasing to 1 mg dose weekly. Advised it may need another PA and when she is due for her next shot, to continue with 0.5 mg. When she gets the 1 mg prescription, can start when she is due again. Advised if the nausea is not bearable to call/message and we can send in Zofran.        Relevant Medications   Semaglutide-Weight Management (WEGOVY) 1 MG/0.5ML SOAJ     Return in about 3 months (around 08/23/2021) for weight Management.      I, Mikey Kirschner, [name] have reviewed all documentation for this visit. The documentation on  05/23/2021 for the exam, diagnosis, procedures, and orders are all accurate and complete.  Mikey Kirschner, [name] [facility] [address] [place], [place], [address] Office: [phone] Fax: [place]

## 2021-05-23 ENCOUNTER — Ambulatory Visit: Payer: Managed Care, Other (non HMO) | Admitting: Physician Assistant

## 2021-05-23 ENCOUNTER — Encounter: Payer: Self-pay | Admitting: Physician Assistant

## 2021-05-23 DIAGNOSIS — N920 Excessive and frequent menstruation with regular cycle: Secondary | ICD-10-CM

## 2021-05-23 DIAGNOSIS — R7303 Prediabetes: Secondary | ICD-10-CM | POA: Diagnosis not present

## 2021-05-23 MED ORDER — WEGOVY 1 MG/0.5ML ~~LOC~~ SOAJ: 1.0000 mg | SUBCUTANEOUS | 0 refills | Status: DC

## 2021-05-23 NOTE — Assessment & Plan Note (Signed)
Discussed other birth control/hormonal options, could switch to progesterone-only pill We will start with trying continuous OCP -- skipping placebo week. If this does not control her cycles will proceed w/ pelvic ultrasound and consider switching OCP

## 2021-05-23 NOTE — Assessment & Plan Note (Signed)
Increasing to 1 mg dose weekly. Advised it may need another PA and when she is due for her next shot, to continue with 0.5 mg. When she gets the 1 mg prescription, can start when she is due again. Advised if the nausea is not bearable to call/message and we can send in Zofran.

## 2021-06-23 ENCOUNTER — Other Ambulatory Visit: Payer: Self-pay | Admitting: Physician Assistant

## 2021-06-23 DIAGNOSIS — R7303 Prediabetes: Secondary | ICD-10-CM

## 2021-07-04 ENCOUNTER — Encounter (INDEPENDENT_AMBULATORY_CARE_PROVIDER_SITE_OTHER): Payer: Managed Care, Other (non HMO) | Admitting: Physician Assistant

## 2021-07-04 MED ORDER — SEMAGLUTIDE-WEIGHT MANAGEMENT 1.7 MG/0.75ML ~~LOC~~ SOAJ: 1.7000 mg | SUBCUTANEOUS | 1 refills | Status: DC

## 2021-07-04 NOTE — Addendum Note (Signed)
Addended byAlfredia Ferguson on: 07/04/2021 02:49 PM   Modules accepted: Orders

## 2021-07-04 NOTE — Telephone Encounter (Signed)

## 2021-07-29 ENCOUNTER — Other Ambulatory Visit: Payer: Self-pay | Admitting: Family Medicine

## 2021-08-15 NOTE — Progress Notes (Unsigned)
I,Brianna Villarreal,acting as a Neurosurgeon for [employer], [name].,have documented all relevant documentation on the behalf of Brianna Ferguson, [name],as directed by  Brianna Ferguson, [name] while in the presence of Brianna Ferguson, [name].  Established patient visit   Patient: Brianna Villarreal   DOB: December 27, 1990   31 y.o. Female  MRN: [id number] Visit Date: 08/18/2021  Today's healthcare provider: [name], [name]   Cc. Weight management  Subjective    HPI  Follow up for weight management  The patient was last seen for this 3 months ago. Changes made at last visit include increasing to 1.7 mg dose weekly.  She reports excellent compliance with treatment. She feels that condition is Improved. She is not having side effects.   Wt Readings from Last 3 Encounters:  08/18/21 (!) 313 lb 14.4 oz (142.4 kg)  05/23/21 (!) 318 lb 6.4 oz (144.4 kg)  02/21/21 (!) 319 lb 3.2 oz (144.8 kg)    -----------------------------------------------------------------------------------------  Pt reports right wrist/arm pain after repetitive movements at work. She has a h/o of tendon surgery on the right arm secondary to this pain/condition before. Reports taking otc antiinflammatories daily. Wearing old brace from surgery.  Medications: Outpatient Medications Prior to Visit  Medication Sig   albuterol (VENTOLIN HFA) 108 (90 Base) MCG/ACT inhaler Inhale 2 puffs into the lungs every 6 (six) hours as needed for wheezing or shortness of breath.   ipratropium (ATROVENT) 0.03 % nasal spray Place 2 sprays into both nostrils 3 (three) times daily as needed for rhinitis.   levocetirizine (XYZAL) 5 MG tablet Take 1 tablet (5 mg total) by mouth every evening.   Norethindrone Acetate-Ethinyl Estradiol (JUNEL 1.5/30) 1.5-30 MG-MCG tablet Take 1 tablet by mouth daily.   traZODone (DESYREL) 50 MG tablet TAKE 1/2 TO 1 TABLET(25 TO 50 MG) BY MOUTH AT BEDTIME AS NEEDED FOR SLEEP   [DISCONTINUED] Semaglutide-Weight  Management 1.7 MG/0.75ML SOAJ Inject 1.7 mg into the skin once a week.   No facility-administered medications prior to visit.    Review of Systems  Constitutional:  Negative for fatigue and fever.  Respiratory:  Negative for cough and shortness of breath.   Cardiovascular:  Negative for chest pain and leg swelling.  Gastrointestinal:  Negative for abdominal pain.  Musculoskeletal:  Positive for arthralgias and myalgias.  Neurological:  Negative for dizziness and headaches.      Objective    Blood pressure 126/82, pulse 88, height 5\' 8"  (1.727 m), weight (!) 313 lb 14.4 oz (142.4 kg), SpO2 100 %.   Physical Exam Constitutional:      General: She is awake.     Appearance: She is well-developed.  HENT:     Head: Normocephalic.  Eyes:     Conjunctiva/sclera: Conjunctivae normal.  Cardiovascular:     Rate and Rhythm: Normal rate and regular rhythm.     Heart sounds: Normal heart sounds.  Pulmonary:     Effort: Pulmonary effort is normal.     Breath sounds: Normal breath sounds.  Musculoskeletal:     Right elbow: Normal. No swelling or effusion. Normal range of motion. No tenderness.  Skin:    General: Skin is warm.  Neurological:     Mental Status: She is alert and oriented to person, place, and time.  Psychiatric:        Attention and Perception: Attention normal.        Mood and Affect: Mood normal.        Speech: Speech normal.  Behavior: Behavior is cooperative.     Results for orders placed or performed in visit on 08/18/21  POCT glycosylated hemoglobin (Hb A1C)  Result Value Ref Range   Hemoglobin A1C 5.8 (A) 4.0 - 5.6 %   Est. average glucose Bld gHb Est-mCnc 120     Assessment & Plan     Problem List Items Addressed This Visit       Other   Prediabetes    A1c today 5.8%      Relevant Orders   POCT glycosylated hemoglobin (Hb A1C) (Completed)   Morbid obesity ([facility]) - Primary    Currently on 1.7 mg of Wegovy. Last weight 3 mo ago 318 lbs,  current 313 lbs.  Discussion on increasing dose to 2.4 mg. Pt had significant nausea on first dose of the 1.7 mg, sending in zofran in case of nausea w/ increase dose.  Discussed diet and exercise again; as this medication alone need support from lifestyle modifications      Relevant Medications   Semaglutide-Weight Management 2.4 MG/0.75ML SOAJ   Right arm pain    Previous dx of tenosynovitis of the right wrist, lateral epicondylitis Based on hx and exam right wrist tenosynovitis most likely Pt has h/o surgery will refer back to ortho rx celebrex instead of alleve Continue w/ brace      Relevant Medications   celecoxib (CELEBREX) 100 MG capsule   Other Relevant Orders   Ambulatory referral to Orthopedics   Other Visit Diagnoses     Nausea       Relevant Medications   ondansetron (ZOFRAN) 4 MG tablet        Return in about 3 months (around 11/18/2021) for weight Management.      I, Brianna Ferguson, [name] have reviewed all documentation for this visit. The documentation on  08/18/2021 for the exam, diagnosis, procedures, and orders are all accurate and complete.  Brianna Ferguson, [name] [facility] [address] [place], [place], [address] Office: [phone] Fax: [phone]   [facility]

## 2021-08-18 ENCOUNTER — Encounter: Payer: Self-pay | Admitting: Physician Assistant

## 2021-08-18 ENCOUNTER — Ambulatory Visit: Payer: Managed Care, Other (non HMO) | Admitting: Physician Assistant

## 2021-08-18 DIAGNOSIS — R110 Nausea: Secondary | ICD-10-CM

## 2021-08-18 DIAGNOSIS — M79601 Pain in right arm: Secondary | ICD-10-CM | POA: Diagnosis not present

## 2021-08-18 DIAGNOSIS — R7303 Prediabetes: Secondary | ICD-10-CM | POA: Diagnosis not present

## 2021-08-18 LAB — POCT GLYCOSYLATED HEMOGLOBIN (HGB A1C)
Est. average glucose Bld gHb Est-mCnc: 120
Hemoglobin A1C: 5.8 % — AB (ref 4.0–5.6)

## 2021-08-18 MED ORDER — SEMAGLUTIDE-WEIGHT MANAGEMENT 2.4 MG/0.75ML ~~LOC~~ SOAJ: 2.4000 mg | SUBCUTANEOUS | 3 refills | Status: DC

## 2021-08-18 MED ORDER — CELECOXIB 100 MG PO CAPS: 100.0000 mg | ORAL_CAPSULE | Freq: Two times a day (BID) | ORAL | 1 refills | Status: DC

## 2021-08-18 MED ORDER — ONDANSETRON HCL 4 MG PO TABS: 4.0000 mg | ORAL_TABLET | Freq: Three times a day (TID) | ORAL | 0 refills | Status: DC | PRN

## 2021-08-18 NOTE — Assessment & Plan Note (Addendum)
A1c today 5.8

## 2021-08-18 NOTE — Assessment & Plan Note (Addendum)
Previous dx of tenosynovitis of the right wrist, lateral epicondylitis Based on hx and exam right wrist tenosynovitis most likely Pt has h/o surgery will refer back to ortho rx celebrex instead of alleve Continue w/ brace

## 2021-08-18 NOTE — Assessment & Plan Note (Addendum)
Currently on 1.7 mg of Wegovy. Last weight 3 mo ago 318 lbs, current 313 lbs.  Discussion on increasing dose to 2.4 mg. Pt had significant nausea on first dose of the 1.7 mg, sending in zofran in case of nausea w/ increase dose.  Discussed diet and exercise again; as this medication alone need support from lifestyle modifications

## 2021-08-19 ENCOUNTER — Other Ambulatory Visit: Payer: Self-pay | Admitting: Physician Assistant

## 2021-08-19 NOTE — Telephone Encounter (Signed)
dc'd 08/18/21 L. Drubel [name]  Requested Prescriptions  Refused Prescriptions Disp Refills  . WEGOVY 1.7 MG/0.75ML SOAJ [Pharmacy Med Name: DCVUDT 1.7mg /dose Pre-Filled Pen Solution for Injection] 3 mL 0    Sig: Inject 1.7 mg under the skin once weekly.     Endocrinology:  Diabetes - GLP-1 Receptor Agonists - semaglutide Failed - 08/19/2021  3:27 PM      Failed - HBA1C in normal range and within 180 days    Hemoglobin A1C  Date Value Ref Range Status  08/18/2021 5.8 (A) 4.0 - 5.6 % Final   Hgb A1c MFr Bld  Date Value Ref Range Status  02/21/2021 6.2 (H) 4.8 - 5.6 % Final    Comment:             Prediabetes: 5.7 - 6.4          Diabetes: >6.4          Glycemic control for adults with diabetes: <7.0          Passed - Cr in normal range and within 360 days    Creatinine, Ser  Date Value Ref Range Status  02/21/2021 0.86 0.57 - 1.00 mg/dL Final         Passed - Valid encounter within last 6 months    Recent Outpatient Visits          Yesterday Morbid obesity [facility])   [facility] [name], [name]   2 months ago Morbid obesity [facility])   [facility] [name], [name]   5 months ago Prediabetes   [facility] Ok Edwards, [place], [name]   9 months ago Encounter for counseling regarding contraception   [facility], Marzella Schlein, MD   1 year ago Encounter for surveillance of injectable contraceptive   [facility] Bacigalupo, Marzella Schlein, MD      Future Appointments            In 3 months Ok Edwards, Lillia Abed, [name] [employer] [facility]

## 2021-08-29 ENCOUNTER — Ambulatory Visit: Payer: Managed Care, Other (non HMO) | Admitting: Physician Assistant

## 2021-11-19 ENCOUNTER — Encounter: Payer: Self-pay | Admitting: Physician Assistant

## 2021-11-24 ENCOUNTER — Encounter: Payer: Self-pay | Admitting: Physician Assistant

## 2021-11-24 ENCOUNTER — Ambulatory Visit: Payer: Managed Care, Other (non HMO) | Admitting: Physician Assistant

## 2021-11-24 DIAGNOSIS — R7989 Other specified abnormal findings of blood chemistry: Secondary | ICD-10-CM | POA: Insufficient documentation

## 2021-11-24 DIAGNOSIS — R7303 Prediabetes: Secondary | ICD-10-CM | POA: Diagnosis not present

## 2021-11-24 DIAGNOSIS — Z23 Encounter for immunization: Secondary | ICD-10-CM | POA: Diagnosis not present

## 2021-11-24 NOTE — Assessment & Plan Note (Signed)
No anemia but hypochromic microcytic RBC Ordered iron panel

## 2021-11-24 NOTE — Assessment & Plan Note (Signed)
Managed with 2.4 mg of Wegovy. Highest weight: 330 11/22 Weight prior to starting: 319 2/23 Weight today: 304  Ordered labs Encouraged exercise F/u 6 mo

## 2021-11-24 NOTE — Progress Notes (Signed)
I,Brianna Villarreal,acting as a Neurosurgeon for [employer], [name].,have documented all relevant documentation on the behalf of Brianna Ferguson, [name],as directed by  Brianna Ferguson, [name] while in the presence of Brianna Ferguson, [name].   Established patient visit   Patient: Brianna Villarreal   DOB: 01-18-1990   31 y.o. Female  MRN: [id number] Visit Date: 11/24/2021  Today's healthcare provider: [name], [name]   Cc.   Subjective    HPI  Follow up for weight management  The patient was last seen for this 3 months ago. Changes made at last visit include discussion on increasing dose to 2.4 mg .  She reports excellent compliance with treatment. She feels that condition is Improved. She is not having side effects.  -----------------------------------------------------------------------------------------   Medications: Outpatient Medications Prior to Visit  Medication Sig   albuterol (VENTOLIN HFA) 108 (90 Base) MCG/ACT inhaler Inhale 2 puffs into the lungs every 6 (six) hours as needed for wheezing or shortness of breath.   celecoxib (CELEBREX) 100 MG capsule Take 1 capsule (100 mg total) by mouth 2 (two) times daily.   fluocinonide (LIDEX) 0.05 % external solution APPLY EXTERNALLY TO THE AFFECTED AREA OF SCALP EVERY DAY AS NEEDED FOR FLARES   ipratropium (ATROVENT) 0.03 % nasal spray Place 2 sprays into both nostrils 3 (three) times daily as needed for rhinitis.   ketoconazole (NIZORAL) 2 % shampoo WASH SCALP AND FACE 3 TIMES A WEEK AS MAINTENANCE   levocetirizine (XYZAL) 5 MG tablet Take 1 tablet (5 mg total) by mouth every evening.   Norethindrone Acetate-Ethinyl Estradiol (JUNEL 1.5/30) 1.5-30 MG-MCG tablet Take 1 tablet by mouth daily.   ondansetron (ZOFRAN) 4 MG tablet Take 1 tablet (4 mg total) by mouth every 8 (eight) hours as needed for nausea or vomiting.   Semaglutide-Weight Management 2.4 MG/0.75ML SOAJ Inject 2.4 mg into the skin once a week.   traZODone (DESYREL) 50 MG  tablet TAKE 1/2 TO 1 TABLET(25 TO 50 MG) BY MOUTH AT BEDTIME AS NEEDED FOR SLEEP   No facility-administered medications prior to visit.    Review of Systems  Constitutional:  Negative for fatigue and fever.  Respiratory:  Negative for cough and shortness of breath.   Cardiovascular:  Negative for chest pain and leg swelling.  Gastrointestinal:  Negative for abdominal pain.  Neurological:  Negative for dizziness and headaches.       Objective    Blood pressure 129/61, pulse 79, height 5\' 8"  (1.727 m), weight (!) 304 lb 9.6 oz (138.2 kg), SpO2 100 %.   Physical Exam Constitutional:      General: She is awake.     Appearance: She is well-developed.  HENT:     Head: Normocephalic.  Eyes:     Conjunctiva/sclera: Conjunctivae normal.  Cardiovascular:     Rate and Rhythm: Normal rate and regular rhythm.     Heart sounds: Normal heart sounds.  Pulmonary:     Effort: Pulmonary effort is normal.     Breath sounds: Normal breath sounds.  Skin:    General: Skin is warm.  Neurological:     Mental Status: She is alert and oriented to person, place, and time.  Psychiatric:        Attention and Perception: Attention normal.        Mood and Affect: Mood normal.        Speech: Speech normal.        Behavior: Behavior is cooperative.      No  results found for any visits on 11/24/21.  Assessment & Plan     Problem List Items Addressed This Visit       Other   Prediabetes    Previously 5.8% Denies hypoglycemic symptoms Repeat A1c today      Relevant Orders   HgB A1c   Morbid obesity (HCC) - Primary    Managed with 2.4 mg of Wegovy. Highest weight: 330 11/22 Weight prior to starting: 319 2/23 Weight today: 304  Ordered labs Encouraged exercise F/u 6 mo       Relevant Orders   CBC w/Diff/Platelet   Comprehensive Metabolic Panel (CMET)   Abnormal CBC    No anemia but hypochromic microcytic RBC Ordered iron panel      Relevant Orders   Iron, TIBC and Ferritin  Panel   Other Visit Diagnoses     Need for immunization against influenza       Relevant Orders   Flu Vaccine QUAD 6+ mos PF IM (Fluarix Quad PF) (Completed)        Return in about 6 months (around 05/25/2022) for CPE.      I, Brianna Ferguson, [name] have reviewed all documentation for this visit. The documentation on  11/24/2021  for the exam, diagnosis, procedures, and orders are all accurate and complete.  Brianna Ferguson, [name] [facility] [address] [place], [place], [address] Office: [phone] Fax: [phone]   [facility]

## 2021-11-24 NOTE — Assessment & Plan Note (Signed)
Previously 5.8% Denies hypoglycemic symptoms Repeat A1c today

## 2021-11-25 LAB — CBC WITH DIFFERENTIAL/PLATELET
Basophils Absolute: 0.1 10*3/uL (ref 0.0–0.2)
Basos: 1 %
EOS (ABSOLUTE): 0.1 10*3/uL (ref 0.0–0.4)
Eos: 1 %
Hematocrit: 39.6 % (ref 34.0–46.6)
Hemoglobin: 12.2 g/dL (ref 11.1–15.9)
Immature Grans (Abs): 0 10*3/uL (ref 0.0–0.1)
Immature Granulocytes: 0 %
Lymphocytes Absolute: 1.8 10*3/uL (ref 0.7–3.1)
Lymphs: 22 %
MCH: 21.9 pg — ABNORMAL LOW (ref 26.6–33.0)
MCHC: 30.8 g/dL — ABNORMAL LOW (ref 31.5–35.7)
MCV: 71 fL — ABNORMAL LOW (ref 79–97)
Monocytes Absolute: 0.8 10*3/uL (ref 0.1–0.9)
Monocytes: 9 %
Neutrophils Absolute: 5.6 10*3/uL (ref 1.4–7.0)
Neutrophils: 67 %
Platelets: 315 10*3/uL (ref 150–450)
RBC: 5.58 x10E6/uL — ABNORMAL HIGH (ref 3.77–5.28)
RDW: 15.5 % — ABNORMAL HIGH (ref 11.7–15.4)
WBC: 8.2 10*3/uL (ref 3.4–10.8)

## 2021-11-25 LAB — COMPREHENSIVE METABOLIC PANEL
ALT: 14 IU/L (ref 0–32)
AST: 19 IU/L (ref 0–40)
Albumin/Globulin Ratio: 1.4 (ref 1.2–2.2)
Albumin: 3.9 g/dL (ref 3.9–4.9)
Alkaline Phosphatase: 85 IU/L (ref 44–121)
BUN/Creatinine Ratio: 12 (ref 9–23)
BUN: 8 mg/dL (ref 6–20)
Bilirubin Total: <0.2 mg/dL (ref 0.0–1.2)
CO2: 23 mmol/L (ref 20–29)
Calcium: 9.4 mg/dL (ref 8.7–10.2)
Chloride: 103 mmol/L (ref 96–106)
Creatinine, Ser: 0.65 mg/dL (ref 0.57–1.00)
Globulin, Total: 2.8 g/dL (ref 1.5–4.5)
Glucose: 89 mg/dL (ref 70–99)
Potassium: 4.6 mmol/L (ref 3.5–5.2)
Sodium: 140 mmol/L (ref 134–144)
Total Protein: 6.7 g/dL (ref 6.0–8.5)
eGFR: 120 mL/min/{1.73_m2} (ref >59)

## 2021-11-25 LAB — HEMOGLOBIN A1C
Est. average glucose Bld gHb Est-mCnc: 120 mg/dL
Hgb A1c MFr Bld: 5.8 % — ABNORMAL HIGH (ref 4.8–5.6)

## 2021-11-25 LAB — IRON,TIBC AND FERRITIN PANEL
Ferritin: 25 ng/mL (ref 15–150)
Iron Saturation: 6 % — CL (ref 15–55)
Iron: 25 ug/dL — ABNORMAL LOW (ref 27–159)
Total Iron Binding Capacity: 391 ug/dL (ref 250–450)
UIBC: 366 ug/dL (ref 131–425)

## 2021-12-01 ENCOUNTER — Telehealth: Payer: Managed Care, Other (non HMO) | Admitting: Family

## 2021-12-01 DIAGNOSIS — M545 Low back pain, unspecified: Secondary | ICD-10-CM

## 2021-12-01 MED ORDER — BACLOFEN 10 MG PO TABS: 10.0000 mg | ORAL_TABLET | Freq: Three times a day (TID) | ORAL | 0 refills | Status: DC

## 2021-12-01 MED ORDER — NAPROXEN 500 MG PO TABS: 500.0000 mg | ORAL_TABLET | Freq: Two times a day (BID) | ORAL | 0 refills | Status: DC

## 2021-12-01 NOTE — Progress Notes (Signed)

## 2021-12-05 NOTE — Progress Notes (Deleted)
      Established patient visit   Patient: Brianna Villarreal   DOB: 05/02/1990   31 y.o. Female  MRN: [id number] Visit Date: 12/08/2021  Today's healthcare provider: [name], [name]   No chief complaint on file.  Subjective    HPI  Back Pain  She reports {chronicity:119221} back pain. There {injury:31712} an injury that may have caused the pain. The most recent episode started {onset initial:119223} and is {progression:119226}. The pain is located in the {location:31199}{radiating to:11559}. It is described as {quality:31200}, is {severity:119268} in intensity, occurring {frequency of symptoms:119294}. {Symptoms are worse in the:16448:::1}  {Aggravating factors:16449:::1} {Relieving factors:16449:::1}.  She has tried {treatments tried:173219}{improvement:119303}.   Associated symptoms: {Yes/No:20286} abdominal pain {Yes/No:20286} bowel incontinence  {Yes/No:20286} chest pain {Yes/No:20286} dysuria   {Yes/No:20286} fever {Yes/No:20286} headaches  {Yes/No:20286} joint pains {Yes/No:20286} pelvic pain  {Yes/No:20286} weakness in leg  {Yes/No:20286} tingling in lower extremities  {Yes/No:20286} urinary incontinence {Yes/No:20286} weight loss    -----------------------------------------------------------------------------------------   Medications: Outpatient Medications Prior to Visit  Medication Sig   albuterol (VENTOLIN HFA) 108 (90 Base) MCG/ACT inhaler Inhale 2 puffs into the lungs every 6 (six) hours as needed for wheezing or shortness of breath.   baclofen (LIORESAL) 10 MG tablet Take 1 tablet (10 mg total) by mouth 3 (three) times daily.   celecoxib (CELEBREX) 100 MG capsule Take 1 capsule (100 mg total) by mouth 2 (two) times daily.   fluocinonide (LIDEX) 0.05 % external solution APPLY EXTERNALLY TO THE AFFECTED AREA OF SCALP EVERY DAY AS NEEDED FOR FLARES   ipratropium (ATROVENT) 0.03 % nasal spray Place 2 sprays into both nostrils 3 (three) times daily as needed for  rhinitis.   ketoconazole (NIZORAL) 2 % shampoo WASH SCALP AND FACE 3 TIMES A WEEK AS MAINTENANCE   levocetirizine (XYZAL) 5 MG tablet Take 1 tablet (5 mg total) by mouth every evening.   naproxen (NAPROSYN) 500 MG tablet Take 1 tablet (500 mg total) by mouth 2 (two) times daily with a meal.   Norethindrone Acetate-Ethinyl Estradiol (JUNEL 1.5/30) 1.5-30 MG-MCG tablet Take 1 tablet by mouth daily.   ondansetron (ZOFRAN) 4 MG tablet Take 1 tablet (4 mg total) by mouth every 8 (eight) hours as needed for nausea or vomiting.   Semaglutide-Weight Management 2.4 MG/0.75ML SOAJ Inject 2.4 mg into the skin once a week.   traZODone (DESYREL) 50 MG tablet TAKE 1/2 TO 1 TABLET(25 TO 50 MG) BY MOUTH AT BEDTIME AS NEEDED FOR SLEEP   No facility-administered medications prior to visit.    Review of Systems  {Labs  Heme  Chem  Endocrine  Serology  Results Review (optional):23779}   Objective    There were no vitals taken for this visit. {Show previous vital signs (optional):23777}  Physical Exam  ***  No results found for any visits on 12/08/21.  Assessment & Plan     ***  No follow-ups on file.      {provider attestation***:1}   [name], [name]  [facility] [phone] (phone) [phone] (fax)  [facility]

## 2021-12-08 ENCOUNTER — Ambulatory Visit: Payer: Managed Care, Other (non HMO) | Admitting: Physician Assistant

## 2021-12-11 ENCOUNTER — Other Ambulatory Visit: Payer: Self-pay | Admitting: Physician Assistant

## 2021-12-11 NOTE — Telephone Encounter (Signed)
Requested Prescriptions  Pending Prescriptions Disp Refills   WEGOVY 2.4 MG/0.75ML SOAJ [Pharmacy Med Name: Reginal Lutes 2.4mg /dose Pre-Filled Pen Solution for Injection] 3 mL 2    Sig: Inject 2.4 mg under the skin once weekly.     Endocrinology:  Diabetes - GLP-1 Receptor Agonists - semaglutide Failed - 12/11/2021  1:36 PM      Failed - HBA1C in normal range and within 180 days    Hgb A1c MFr Bld  Date Value Ref Range Status  11/24/2021 5.8 (H) 4.8 - 5.6 % Final    Comment:             Prediabetes: 5.7 - 6.4          Diabetes: >6.4          Glycemic control for adults with diabetes: <7.0          Passed - Cr in normal range and within 360 days    Creatinine, Ser  Date Value Ref Range Status  11/24/2021 0.65 0.57 - 1.00 mg/dL Final         Passed - Valid encounter within last 6 months    Recent Outpatient Visits           2 weeks ago Morbid obesity [facility])   [facility] [name], [name]   3 months ago Morbid obesity [facility])   [facility] [name], [name]   6 months ago Morbid obesity [facility])   [facility] [name], [name]   9 months ago Prediabetes   [facility] [name], [name]   1 year ago Encounter for counseling regarding contraception   [facility], Marzella Schlein, MD       Future Appointments             In 5 months Ok Edwards, Lillia Abed, [name] [facility], [facility]

## 2022-02-05 ENCOUNTER — Other Ambulatory Visit: Payer: Self-pay | Admitting: Physician Assistant

## 2022-02-06 NOTE — Telephone Encounter (Signed)
Requested Prescriptions  Pending Prescriptions Disp Refills   WEGOVY 2.4 MG/0.75ML SOAJ [Pharmacy Med Name: Mancel Parsons 2.4mg /dose Pre-Filled Pen Solution for Injection] 3 mL 0    Sig: Inject 2.4 mg under the skin once weekly.     Endocrinology:  Diabetes - GLP-1 Receptor Agonists - semaglutide Failed - 02/05/2022  8:42 PM      Failed - HBA1C in normal range and within 180 days    Hgb A1c MFr Bld  Date Value Ref Range Status  11/24/2021 5.8 (H) 4.8 - 5.6 % Final    Comment:             Prediabetes: 5.7 - 6.4          Diabetes: >6.4          Glycemic control for adults with diabetes: <7.0          Passed - Cr in normal range and within 360 days    Creatinine, Ser  Date Value Ref Range Status  11/24/2021 0.65 0.57 - 1.00 mg/dL Final         Passed - Valid encounter within last 6 months    Recent Outpatient Visits           2 months ago Morbid obesity [facility])   [place] Mikey Kirschner, [name]   5 months ago Morbid obesity [facility])   [place] Mikey Kirschner, [name]   8 months ago Morbid obesity [facility])   [place] Mikey Kirschner, [name]   11 months ago [place] Mikey Kirschner, [name]   1 year ago Encounter for counseling regarding contraception   [place] Farmer, Dionne Bucy, MD       Future Appointments             In 3 months Thedore Mins, [place], [place]

## 2022-04-08 ENCOUNTER — Ambulatory Visit: Payer: Managed Care, Other (non HMO) | Admitting: Family Medicine

## 2022-04-08 ENCOUNTER — Ambulatory Visit: Payer: Managed Care, Other (non HMO) | Admitting: Physician Assistant

## 2022-04-08 VITALS — BP 110/68 | HR 75 | Temp 98.6°F | Resp 20 | Wt 319.0 lb

## 2022-04-08 DIAGNOSIS — R7303 Prediabetes: Secondary | ICD-10-CM

## 2022-04-08 DIAGNOSIS — M62838 Other muscle spasm: Secondary | ICD-10-CM | POA: Diagnosis not present

## 2022-04-08 DIAGNOSIS — M545 Low back pain, unspecified: Secondary | ICD-10-CM | POA: Insufficient documentation

## 2022-04-08 DIAGNOSIS — D500 Iron deficiency anemia secondary to blood loss (chronic): Secondary | ICD-10-CM

## 2022-04-08 DIAGNOSIS — E7800 Pure hypercholesterolemia, unspecified: Secondary | ICD-10-CM

## 2022-04-08 DIAGNOSIS — G5601 Carpal tunnel syndrome, right upper limb: Secondary | ICD-10-CM

## 2022-04-08 DIAGNOSIS — F5101 Primary insomnia: Secondary | ICD-10-CM

## 2022-04-08 MED ORDER — MELOXICAM 15 MG PO TABS: 15.0000 mg | ORAL_TABLET | Freq: Every day | ORAL | 0 refills | Status: DC

## 2022-04-08 MED ORDER — PREDNISONE 50 MG PO TABS
50.0000 mg | ORAL_TABLET | Freq: Every day | ORAL | 0 refills | Status: DC
Start: 2022-04-09 — End: 2023-01-20

## 2022-04-08 MED ORDER — METHOCARBAMOL 500 MG PO TABS
500.0000 mg | ORAL_TABLET | Freq: Four times a day (QID) | ORAL | 1 refills | Status: DC
Start: 2022-04-08 — End: 2023-08-16

## 2022-04-08 NOTE — Progress Notes (Signed)
I,Sulibeya S Dimas,acting as a Education administrator for Gwyneth Sprout, FNP.,have documented all relevant documentation on the behalf of Gwyneth Sprout, FNP,as directed by  Gwyneth Sprout, FNP while in the presence of Gwyneth Sprout, FNP.   Established patient visit  Patient: Brianna Villarreal   DOB: [birth year]   32 y.o. Female  MRN: [id number] Visit Date: 04/08/2022  Today's healthcare provider: Gwyneth Sprout, FNP  Patient presents for new patient visit to establish care.  Introduced to Designer, jewellery role and practice setting.  All questions answered.  Discussed provider/patient relationship and expectations.  Chief Complaint  Patient presents with   Back Pain   Subjective    HPI HPI     Back Pain   This is a recurrent problem.  There was not an injury that may have caused the pain.  Recent episode started yesterday.  The problem has been gradually worsening since onset.  Pain is lumbar spine.  The pain radiates to left thigh.  The quality of pain is described as aching and sharp.  Severity of the pain is moderate.  Pain occurs constantly.  The symptoms are aggravated by bending, position, sitting, standing, twisting and walking.  Symptoms are relieved by sitting.  Treatments: baclofen.  Treatment provided no relief.  Dysuria: Absent.  Weakness in leg: Absent.  Tingling in lower extremities: Absent.      Last edited by Dorian Pod, CMA on 04/08/2022  3:11 PM.       Medications: Outpatient Medications Prior to Visit  Medication Sig   albuterol (VENTOLIN HFA) 108 (90 Base) MCG/ACT inhaler Inhale 2 puffs into the lungs every 6 (six) hours as needed for wheezing or shortness of breath.   fluocinonide (LIDEX) 0.05 % external solution APPLY EXTERNALLY TO THE AFFECTED AREA OF SCALP EVERY DAY AS NEEDED FOR FLARES   ipratropium (ATROVENT) 0.03 % nasal spray Place 2 sprays into both nostrils 3 (three) times daily as needed for rhinitis.   ketoconazole (NIZORAL) 2 % shampoo WASH SCALP AND FACE 3  TIMES A WEEK AS MAINTENANCE   levocetirizine (XYZAL) 5 MG tablet Take 1 tablet (5 mg total) by mouth every evening.   ondansetron (ZOFRAN) 4 MG tablet Take 1 tablet (4 mg total) by mouth every 8 (eight) hours as needed for nausea or vomiting.   Semaglutide-Weight Management (WEGOVY) 2.4 MG/0.75ML SOAJ Inject 2.4 mg under the skin once weekly.   traZODone (DESYREL) 50 MG tablet TAKE 1/2 TO 1 TABLET(25 TO 50 MG) BY MOUTH AT BEDTIME AS NEEDED FOR SLEEP   [DISCONTINUED] baclofen (LIORESAL) 10 MG tablet Take 1 tablet (10 mg total) by mouth 3 (three) times daily.   [DISCONTINUED] celecoxib (CELEBREX) 100 MG capsule Take 1 capsule (100 mg total) by mouth 2 (two) times daily.   Norethindrone Acetate-Ethinyl Estradiol (JUNEL 1.5/30) 1.5-30 MG-MCG tablet Take 1 tablet by mouth daily. (Patient not taking: Reported on 04/08/2022)   [DISCONTINUED] naproxen (NAPROSYN) 500 MG tablet Take 1 tablet (500 mg total) by mouth 2 (two) times daily with a meal.   No facility-administered medications prior to visit.    Review of Systems  Constitutional:  Positive for fatigue. Negative for appetite change.  Respiratory:  Negative for cough and shortness of breath.   Cardiovascular:  Negative for chest pain.  Musculoskeletal:  Positive for back pain and myalgias.     Objective    BP 110/68 (BP Location: Left Arm, Patient Position: Sitting, Cuff Size: Large)   Pulse 75  Temp 98.6 F (37 C) (Temporal)   Resp 20   Wt (!) 319 lb (144.7 kg)   LMP 03/31/2022 (Exact Date)   BMI 48.50 kg/m   Physical Exam Vitals and nursing note reviewed.  Constitutional:      General: She is not in acute distress.    Appearance: Normal appearance. She is obese. She is not ill-appearing, toxic-appearing or diaphoretic.  HENT:     Head: Normocephalic and atraumatic.  Cardiovascular:     Rate and Rhythm: Normal rate and regular rhythm.     Pulses: Normal pulses.     Heart sounds: Normal heart sounds. No murmur heard.    No  friction rub. No gallop.  Pulmonary:     Effort: Pulmonary effort is normal. No respiratory distress.     Breath sounds: Normal breath sounds. No stridor. No wheezing, rhonchi or rales.  Chest:     Chest wall: No tenderness.  Abdominal:     General: Bowel sounds are normal.     Palpations: Abdomen is soft.     Tenderness: There is no right CVA tenderness or left CVA tenderness.  Musculoskeletal:        General: No swelling, deformity or signs of injury. Normal range of motion.     Lumbar back: Spasms and tenderness present.       Back:     Right lower leg: Normal. No edema.     Left lower leg: Normal. No edema.  Skin:    General: Skin is warm and dry.     Capillary Refill: Capillary refill takes less than 2 seconds.     Coloration: Skin is not jaundiced or pale.     Findings: No bruising, erythema, lesion or rash.  Neurological:     General: No focal deficit present.     Mental Status: She is alert and oriented to person, place, and time. Mental status is at baseline.     Cranial Nerves: No cranial nerve deficit.     Sensory: No sensory deficit.     Motor: No weakness.     Coordination: Coordination normal.  Psychiatric:        Mood and Affect: Mood normal.        Behavior: Behavior normal.        Thought Content: Thought content normal.        Judgment: Judgment normal.     No results found for any visits on 04/08/22.  Assessment & Plan     Problem List Items Addressed This Visit       Nervous and Auditory   Carpal tunnel syndrome of right wrist    Chronic, no current flare Recommend thumb spica brace to stabilize during sleep Encourage stretching given repetitive nature of work      Relevant Medications   methocarbamol (ROBAXIN) 500 MG tablet     Other   Acute bilateral low back pain without sciatica - Primary    Acute flare of chronic problem; last flare 11/2021 Not improved with naproxen and baclofen Will change to pred burst as well as mobic and  robaxin Stretches and work note provided      Relevant Medications   meloxicam (MOBIC) 15 MG tablet   methocarbamol (ROBAXIN) 500 MG tablet   predniSONE (DELTASONE) 50 MG tablet (Start on 04/09/2022)   Elevated LDL cholesterol level    Chronic, repeat LP I continue to recommend diet low in saturated fat and regular exercise - 30 min at least 5 times per  week LDL goal of <100      Relevant Orders   Lipid Profile   Iron deficiency anemia due to chronic blood loss    Chronic, in setting of heavy menses Repeat CBC and diff      Relevant Orders   Iron, TIBC and Ferritin Panel   B12 and Folate Panel   Vitamin D (25 hydroxy)   Morbid obesity    Chronic, stable Discussed importance of healthy weight management Discussed diet and exercise Body mass index is 48.5 kg/m.       Relevant Medications   meloxicam (MOBIC) 15 MG tablet   methocarbamol (ROBAXIN) 500 MG tablet   Other Relevant Orders   Lipid Profile   Basic Metabolic Panel (BMET)   CBC with Differential/Platelet   Iron, TIBC and Ferritin Panel   Hemoglobin A1c   Prediabetes    Chronic, stable without meds Repeat A1c Continue to recommend balanced, lower carb meals. Smaller meal size, adding snacks. Choosing water as drink of choice and increasing purposeful exercise.       Relevant Orders   Hemoglobin A1c   Primary insomnia    Chronic, stable Recommend Vit D labs to assist       Relevant Orders   Vitamin D (25 hydroxy)   Spasm of left piriformis muscle    Acute; had to leave work today d/t pain and poor stamina for walking or sitting. Negative SLR. Poor external rotation of left LE. Multifocal treatment recommend.       Relevant Medications   meloxicam (MOBIC) 15 MG tablet   methocarbamol (ROBAXIN) 500 MG tablet   predniSONE (DELTASONE) 50 MG tablet (Start on 04/09/2022)   Return if symptoms worsen or fail to improve.     Vonna Kotyk, FNP, have reviewed all documentation for this visit. The  documentation on 04/08/22 for the exam, diagnosis, procedures, and orders are all accurate and complete.  Gwyneth Sprout, [place] [phone] (phone) [phone] (fax)  [place]

## 2022-04-08 NOTE — Assessment & Plan Note (Signed)
Chronic, in setting of heavy menses Repeat CBC and diff

## 2022-04-08 NOTE — Assessment & Plan Note (Signed)
Chronic, repeat LP I continue to recommend diet low in saturated fat and regular exercise - 30 min at least 5 times per week LDL goal of <100

## 2022-04-08 NOTE — Assessment & Plan Note (Signed)
Chronic, stable Discussed importance of healthy weight management Discussed diet and exercise Body mass index is 48.5 kg/m.

## 2022-04-08 NOTE — Assessment & Plan Note (Signed)
Chronic, no current flare Recommend thumb spica brace to stabilize during sleep Encourage stretching given repetitive nature of work

## 2022-04-08 NOTE — Assessment & Plan Note (Signed)
Acute flare of chronic problem; last flare 11/2021 Not improved with naproxen and baclofen Will change to pred burst as well as mobic and robaxin Stretches and work note provided

## 2022-04-08 NOTE — Assessment & Plan Note (Signed)
Acute; had to leave work today d/t pain and poor stamina for walking or sitting. Negative SLR. Poor external rotation of left LE. Multifocal treatment recommend.

## 2022-04-08 NOTE — Assessment & Plan Note (Signed)
Chronic, stable Recommend Vit D labs to assist

## 2022-04-08 NOTE — Assessment & Plan Note (Signed)
Chronic, stable without meds Repeat A1c Continue to recommend balanced, lower carb meals. Smaller meal size, adding snacks. Choosing water as drink of choice and increasing purposeful exercise.

## 2022-04-09 ENCOUNTER — Other Ambulatory Visit: Payer: Self-pay | Admitting: Family Medicine

## 2022-04-09 LAB — CBC WITH DIFFERENTIAL/PLATELET
Basophils Absolute: 0.1 10*3/uL (ref 0.0–0.2)
Basos: 1 %
EOS (ABSOLUTE): 0.2 10*3/uL (ref 0.0–0.4)
Eos: 2 %
Hematocrit: 38.6 % (ref 34.0–46.6)
Hemoglobin: 11.9 g/dL (ref 11.1–15.9)
Immature Grans (Abs): 0 10*3/uL (ref 0.0–0.1)
Immature Granulocytes: 0 %
Lymphocytes Absolute: 2.3 10*3/uL (ref 0.7–3.1)
Lymphs: 27 %
MCH: 21.6 pg — ABNORMAL LOW (ref 26.6–33.0)
MCHC: 30.8 g/dL — ABNORMAL LOW (ref 31.5–35.7)
MCV: 70 fL — ABNORMAL LOW (ref 79–97)
Monocytes Absolute: 0.7 10*3/uL (ref 0.1–0.9)
Monocytes: 8 %
Neutrophils Absolute: 5.3 10*3/uL (ref 1.4–7.0)
Neutrophils: 62 %
Platelets: 338 10*3/uL (ref 150–450)
RBC: 5.5 x10E6/uL — ABNORMAL HIGH (ref 3.77–5.28)
RDW: 17.4 % — ABNORMAL HIGH (ref 11.7–15.4)
WBC: 8.5 10*3/uL (ref 3.4–10.8)

## 2022-04-09 LAB — IRON,TIBC AND FERRITIN PANEL
Ferritin: 27 ng/mL (ref 15–150)
Iron Saturation: 7 % — CL (ref 15–55)
Iron: 29 ug/dL (ref 27–159)
Total Iron Binding Capacity: 407 ug/dL (ref 250–450)
UIBC: 378 ug/dL (ref 131–425)

## 2022-04-09 LAB — B12 AND FOLATE PANEL
Folate: 9.3 ng/mL (ref >3.0)
Vitamin B-12: 715 pg/mL (ref 232–1245)

## 2022-04-09 LAB — LIPID PANEL
Chol/HDL Ratio: 4.7 ratio — ABNORMAL HIGH (ref 0.0–4.4)
Cholesterol, Total: 177 mg/dL (ref 100–199)
HDL: 38 mg/dL — ABNORMAL LOW (ref >39)
LDL Chol Calc (NIH): 95 mg/dL (ref 0–99)
Triglycerides: 260 mg/dL — ABNORMAL HIGH (ref 0–149)
VLDL Cholesterol Cal: 44 mg/dL — ABNORMAL HIGH (ref 5–40)

## 2022-04-09 LAB — BASIC METABOLIC PANEL
BUN/Creatinine Ratio: 11 (ref 9–23)
BUN: 8 mg/dL (ref 6–20)
CO2: 21 mmol/L (ref 20–29)
Calcium: 9.1 mg/dL (ref 8.7–10.2)
Chloride: 107 mmol/L — ABNORMAL HIGH (ref 96–106)
Creatinine, Ser: 0.7 mg/dL (ref 0.57–1.00)
Glucose: 116 mg/dL — ABNORMAL HIGH (ref 70–99)
Potassium: 4.4 mmol/L (ref 3.5–5.2)
Sodium: 138 mmol/L (ref 134–144)
eGFR: 117 mL/min/{1.73_m2} (ref >59)

## 2022-04-09 LAB — VITAMIN D 25 HYDROXY (VIT D DEFICIENCY, FRACTURES): Vit D, 25-Hydroxy: 13.3 ng/mL — ABNORMAL LOW (ref 30.0–100.0)

## 2022-04-09 LAB — HEMOGLOBIN A1C
Est. average glucose Bld gHb Est-mCnc: 120 mg/dL
Hgb A1c MFr Bld: 5.8 % — ABNORMAL HIGH (ref 4.8–5.6)

## 2022-04-09 MED ORDER — VITAMIN D (ERGOCALCIFEROL) 1.25 MG (50000 UNIT) PO CAPS: 50000.0000 [IU] | ORAL_CAPSULE | ORAL | 0 refills | Status: DC

## 2022-04-09 NOTE — Progress Notes (Signed)
Iron saturation remains low. Continue to recommend use of OTC Iron Fe 65 twice daily and iron rich diet. Cholesterol is improved. Bad cholesterol [LDL] is at goal of <100. Recommend Vit D supplement; I can Rx a 50,000 IU weekly dose or you can pick up a 5,000 IU OTC dose for daily use. Recommend repeat labs in 3-6 months. Prediabetes remains unchanged. Continue to recommend balanced, lower carb meals. Smaller meal size, adding snacks. Choosing water as drink of choice and increasing purposeful exercise. Vit B12 and folate are stable.

## 2022-04-20 ENCOUNTER — Other Ambulatory Visit: Payer: Self-pay | Admitting: Family Medicine

## 2022-04-20 DIAGNOSIS — M545 Low back pain, unspecified: Secondary | ICD-10-CM

## 2022-04-20 DIAGNOSIS — M62838 Other muscle spasm: Secondary | ICD-10-CM

## 2022-04-21 NOTE — Telephone Encounter (Signed)
Requested medications are due for refill today.  unsure  Requested medications are on the active medications list.  yes  Last refill. 04/08/2022 #15 0 rf  Future visit scheduled.   yes  Notes to clinic.  Unclear if medication is to be continued. Rx was written for 2 week supply.    Requested Prescriptions  Pending Prescriptions Disp Refills   meloxicam (MOBIC) 15 MG tablet [Pharmacy Med Name: MELOXICAM  TABLETS] 15 tablet 0    Sig: TAKE 1 TABLET(15 MG) BY MOUTH DAILY     Analgesics:  COX2 Inhibitors Failed - 04/20/2022  8:08 AM      Failed - Manual Review: Labs are only required if the patient has taken medication for more than 8 weeks.      Passed - HGB in normal range and within 360 days    Hemoglobin  Date Value Ref Range Status  04/08/2022 11.9 11.1 - 15.9 g/dL Final         Passed - Cr in normal range and within 360 days    Creatinine, Ser  Date Value Ref Range Status  04/08/2022 0.70 0.57 - 1.00 mg/dL Final         Passed - HCT in normal range and within 360 days    Hematocrit  Date Value Ref Range Status  04/08/2022 38.6 34.0 - 46.6 % Final         Passed - AST in normal range and within 360 days    AST  Date Value Ref Range Status  11/24/2021 19 0 - 40 IU/L Final         Passed - ALT in normal range and within 360 days    ALT  Date Value Ref Range Status  11/24/2021 14 0 - 32 IU/L Final         Passed - eGFR is 30 or above and within 360 days    GFR calc Af Amer  Date Value Ref Range Status  05/11/2019 116 >59 mL/min/1.73 Final    Comment:    **[facility] currently reports eGFR in compliance with the current**   recommendations of the [employer]. [facility] will   update reporting as new guidelines are published from the NKF-ASN   Task force.    GFR calc non Af Amer  Date Value Ref Range Status  05/11/2019 101 >59 mL/min/1.73 Final   eGFR  Date Value Ref Range Status  04/08/2022 117 >59 mL/min/1.73 Final         Passed -  Patient is not pregnant      Passed - Valid encounter within last 12 months    Recent Outpatient Visits           1 week ago Acute bilateral low back pain without sciatica   [facility] Jacky Kindle, FNP   4 months ago Morbid obesity [facility])   [place] [facility] [name], [name]   8 months ago Morbid obesity [facility])   [place] [facility] [name], [name]   11 months ago Morbid obesity [facility])   [place] [facility] [name], [name]   1 year ago Prediabetes   [facility] [name], [name]       Future Appointments             In 1 month Ok Edwards, Lou [place] [facility]

## 2022-04-22 ENCOUNTER — Telehealth: Payer: Self-pay | Admitting: Physician Assistant

## 2022-04-22 NOTE — Telephone Encounter (Signed)
Brianna Villarreal (Key: [id number]) Wegovy 2.4MG /0.75ML auto-injectors Status: New - UnsentCreated: April 17th, 2024 [id number] Open  Archived - Outcome: Unsent  Archive Reason: Duplicate Request

## 2022-04-22 NOTE — Telephone Encounter (Signed)
CoverMyMeds requesting prior authorization Key: [id number] Name: Brianna Villarreal DOD: 05-31-1990 Medication name not listed

## 2022-04-22 NOTE — Telephone Encounter (Signed)
Started re-auth for [employer]  [id number]

## 2022-05-25 ENCOUNTER — Encounter: Payer: Managed Care, Other (non HMO) | Admitting: Physician Assistant

## 2022-06-22 ENCOUNTER — Encounter: Payer: Self-pay | Admitting: Physician Assistant

## 2022-06-22 ENCOUNTER — Ambulatory Visit (INDEPENDENT_AMBULATORY_CARE_PROVIDER_SITE_OTHER): Payer: Managed Care, Other (non HMO) | Admitting: Physician Assistant

## 2022-06-22 VITALS — BP 127/78 | HR 66 | Temp 98.0°F | Resp 13 | Ht 68.0 in | Wt 313.1 lb

## 2022-06-22 DIAGNOSIS — Z0000 Encounter for general adult medical examination without abnormal findings: Secondary | ICD-10-CM | POA: Diagnosis not present

## 2022-06-22 DIAGNOSIS — G8929 Other chronic pain: Secondary | ICD-10-CM | POA: Diagnosis not present

## 2022-06-22 DIAGNOSIS — M5442 Lumbago with sciatica, left side: Secondary | ICD-10-CM

## 2022-06-22 MED ORDER — ZEPBOUND 2.5 MG/0.5ML ~~LOC~~ SOAJ
2.5000 mg | SUBCUTANEOUS | 0 refills | Status: DC
Start: 2022-06-22 — End: 2022-07-24

## 2022-06-22 NOTE — Progress Notes (Signed)
I,Vanessa  Vital,acting as a Neurosurgeon for [employer], [name].,have documented all relevant documentation on the behalf of [name], [name],as directed by  [name], [name] while in the presence of [name], [name].   Complete physical exam   Patient: Brianna Villarreal   DOB: 1990-05-29   32 y.o. Female  MRN: [id number] Visit Date: 06/22/2022  Today's healthcare provider: [name], [name]   Cc. Cpe  Subjective    Brianna Villarreal is a 32 y.o. female who presents today for a complete physical exam.  She reports consuming a general diet. She reports to exercise about 30 minutes a day walking. She generally feels fairly well. She reports sleeping fairly well. She does have additional problems to discuss today.  HPI   Discussed the use of AI scribe software for clinical note transcription with the patient, who gave verbal consent to proceed.  History of Present Illness   The patient, with a history of anemia, iron deficiency, and vitamin D deficiency, presents with recurrent low back pain. The pain is located in the middle of the back, right above the tailbone, and radiates down the buttocks on the left side and occasionally down the entire leg. The pain is exacerbated by walking upstairs and sitting, and has occasionally been severe enough to prevent standing. The patient reports that the pain has been a recurrent issue, with periods of relief followed by sudden exacerbation, such as waking up with severe pain or experiencing pain after walking upstairs.   The patient also reports difficulty obtaining her weight loss medication, Wegovy, due to shortages.       Past Medical History:  Diagnosis Date   GERD (gastroesophageal reflux disease)    Insomnia    Past Surgical History:  Procedure Laterality Date   NO PAST SURGERIES     Social History   Socioeconomic History   Marital status: Single    Spouse name: Not on file   Number of children: Not on file   Years of  education: Not on file   Highest education level: Bachelor's degree (e.g., BA, AB, BS)  Occupational History   Not on file  Tobacco Use   Smoking status: Never   Smokeless tobacco: Never  Vaping Use   Vaping Use: Never used  Substance and Sexual Activity   Alcohol use: Yes    Alcohol/week: 0.0 standard drinks of alcohol    Comment: occasional   Drug use: No   Sexual activity: Not Currently    Birth control/protection: Condom  Other Topics Concern   Not on file  Social History Narrative   Not on file   Social Determinants of Health   Financial Resource Strain: Not on file  Food Insecurity: Not on file  Transportation Needs: No Transportation Needs (04/08/2022)   PRAPARE - Administrator, Civil Service (Medical): No    Lack of Transportation (Non-Medical): No  Physical Activity: Insufficiently Active (04/08/2022)   Exercise Vital Sign    Days of Exercise per Week: 3 days    Minutes of Exercise per Session: 30 min  Stress: Not on file  Social Connections: Unknown (04/08/2022)   Social Connection and Isolation Panel [NHANES]    Frequency of Communication with Friends and Family: More than three times a week    Frequency of Social Gatherings with Friends and Family: Once a week    Attends Religious Services: Patient declined    Database administrator or Organizations: Patient declined  Attends Engineer, structural: Not on file    Marital Status: Never married  Intimate Partner Violence: Not on file   Family Status  Relation Name Status   Mother  Alive   Father  Alive   Brother  Alive   MGM  Deceased   MGF  Deceased   PGM  Deceased   PGF  Deceased   Family History  Problem Relation Age of Onset   Hypertension Mother    Allergies  Allergen Reactions   Latex Hives, Itching and Swelling    Patient Care Team: [name], [name] as PCP - General (Physician Assistant)   Medications: Outpatient Medications Prior to Visit  Medication Sig    albuterol (VENTOLIN HFA) 108 (90 Base) MCG/ACT inhaler Inhale 2 puffs into the lungs every 6 (six) hours as needed for wheezing or shortness of breath.   fluocinonide (LIDEX) 0.05 % external solution APPLY EXTERNALLY TO THE AFFECTED AREA OF SCALP EVERY DAY AS NEEDED FOR FLARES   ipratropium (ATROVENT) 0.03 % nasal spray Place 2 sprays into both nostrils 3 (three) times daily as needed for rhinitis.   ketoconazole (NIZORAL) 2 % shampoo WASH SCALP AND FACE 3 TIMES A WEEK AS MAINTENANCE   levocetirizine (XYZAL) 5 MG tablet Take 1 tablet (5 mg total) by mouth every evening.   meloxicam (MOBIC) 15 MG tablet TAKE 1 TABLET(15 MG) BY MOUTH DAILY   methocarbamol (ROBAXIN) 500 MG tablet Take 1 tablet (500 mg total) by mouth 4 (four) times daily.   ondansetron (ZOFRAN) 4 MG tablet Take 1 tablet (4 mg total) by mouth every 8 (eight) hours as needed for nausea or vomiting.   predniSONE (DELTASONE) 50 MG tablet Take 1 tablet (50 mg total) by mouth daily with breakfast.   traZODone (DESYREL) 50 MG tablet TAKE 1/2 TO 1 TABLET(25 TO 50 MG) BY MOUTH AT BEDTIME AS NEEDED FOR SLEEP   Vitamin D, Ergocalciferol, (DRISDOL) 1.25 MG (50000 UNIT) CAPS capsule Take 1 capsule (50,000 Units total) by mouth every 7 (seven) days.   [DISCONTINUED] Semaglutide-Weight Management (WEGOVY) 2.4 MG/0.75ML SOAJ Inject 2.4 mg under the skin once weekly.   No facility-administered medications prior to visit.    Review of Systems  Musculoskeletal:  Positive for back pain.  All other systems reviewed and are negative.   Objective    BP 127/78 (BP Location: Left Arm, Patient Position: Sitting, Cuff Size: Large)   Pulse 66   Temp 98 F (36.7 C) (Oral)   Resp 13   Ht 5\' 8"  (1.727 m)   Wt (!) 313 lb 1.6 oz (142 kg)   LMP 06/19/2022 (Exact Date)   SpO2 100%   BMI 47.61 kg/m   Physical Exam Constitutional:      General: She is awake.     Appearance: She is well-developed. She is not ill-appearing.  HENT:     Head:  Normocephalic.     Right Ear: Tympanic membrane normal.     Left Ear: Tympanic membrane normal.     Nose: Nose normal. No congestion or rhinorrhea.     Mouth/Throat:     Pharynx: No oropharyngeal exudate or posterior oropharyngeal erythema.  Eyes:     Conjunctiva/sclera: Conjunctivae normal.     Pupils: Pupils are equal, round, and reactive to light.  Neck:     Thyroid: No thyroid mass or thyromegaly.  Cardiovascular:     Rate and Rhythm: Normal rate and regular rhythm.     Heart sounds: Normal heart sounds.  Pulmonary:     Effort: Pulmonary effort is normal.     Breath sounds: Normal breath sounds.  Abdominal:     Palpations: Abdomen is soft.     Tenderness: There is no abdominal tenderness.  Musculoskeletal:     Right lower leg: No swelling. No edema.     Left lower leg: No swelling. No edema.  Lymphadenopathy:     Cervical: No cervical adenopathy.  Skin:    General: Skin is warm.  Neurological:     Mental Status: She is alert and oriented to person, place, and time.  Psychiatric:        Attention and Perception: Attention normal.        Mood and Affect: Mood normal.        Speech: Speech normal.        Behavior: Behavior normal. Behavior is cooperative.     Last depression screening scores    06/22/2022   10:43 AM 04/08/2022    3:16 PM 11/24/2021   10:08 AM  PHQ 2/9 Scores  PHQ - 2 Score 0 0 0  PHQ- 9 Score 2 2 2    Last fall risk screening    06/22/2022   10:43 AM  Fall Risk   Falls in the past year? 0  Number falls in past yr: 0  Injury with Fall? 0  Risk for fall due to : No Fall Risks  Follow up Falls evaluation completed   Last Audit-C alcohol use screening    06/22/2022   10:44 AM  Alcohol Use Disorder Test (AUDIT)  1. How often do you have a drink containing alcohol? 1  2. How many drinks containing alcohol do you have on a typical day when you are drinking? 0  3. How often do you have six or more drinks on one occasion? 0  AUDIT-C Score 1   A  score of 3 or more in women, and 4 or more in men indicates increased risk for alcohol abuse, EXCEPT if all of the points are from question 1   No results found for any visits on 06/22/22.  Assessment & Plan    Routine Health Maintenance and Physical Exam  Exercise Activities and Dietary recommendations --balanced diet high in fiber and protein, low in sugars, carbs, fats. --physical activity/exercise 30 minutes 3-5 times a week    Immunization History  Administered Date(s) Administered   DTaP 05/12/1989, 08/04/1989, 09/27/1989, 10/27/1990, 03/17/1993   HIB (PRP-OMP) 05/12/1989, 08/04/1989, 09/27/1989, 07/27/1990   HPV Quadrivalent 09/24/2006, 11/26/2006   Hepatitis A, Adult 10/21/2016   Hepatitis B 10/27/1990, 12/06/1990, 08/31/1991   IPV 05/12/1989, 05/12/1989, 08/04/1989, 09/27/1989, 10/27/1990, 03/17/1993   Influenza,inj,Quad PF,6+ Mos 09/04/2015, 10/21/2016, 10/19/2017, 11/15/2020, 11/24/2021   MMR 02/26/1990, 02/26/1990, 03/17/1993, 07/20/2007   Meningococcal Conjugate 09/24/2006   Td 08/20/2016   Tdap 09/24/2006    Health Maintenance  Topic Date Due   COVID-19 Vaccine (1) Never done   HPV VACCINES (3 - 3-dose series) 03/24/2007   INFLUENZA VACCINE  08/06/2022   PAP SMEAR-Modifier  06/06/2024   DTaP/Tdap/Td (8 - Td or Tdap) 08/21/2026   Hepatitis C Screening  Completed   HIV Screening  Completed    Discussed health benefits of physical activity, and encouraged her to engage in regular exercise appropriate for her age and condition. 1. Annual physical exam Reviewed labs. Pap is not due yet  2. Chronic midline low back pain with left-sided sciatica - Ambulatory referral to Physical Therapy  3. Morbid obesity (HCC) Try to  switch to zepbound d/t wegovy supply issues  - tirzepatide (ZEPBOUND) 2.5 MG/0.5ML Pen; Inject 2.5 mg into the skin once a week.  Dispense: 2 mL; Refill: 0  Return in about 6 months (around 12/22/2022) for chronic conditions.     I, [name], [name] have reviewed all documentation for this visit. The documentation on  06/22/22   for the exam, diagnosis, procedures, and orders are all accurate and complete.  [name], [name] [facility] [address] [place], [place], [address] Office: [phone] Fax: [phone]   [facility]

## 2022-07-10 ENCOUNTER — Telehealth: Payer: Self-pay | Admitting: Physician Assistant

## 2022-07-10 NOTE — Telephone Encounter (Signed)
Received a fax from covermymeds for Zepbound 2.5mg /0.21ml  Key: [id number]

## 2022-07-13 ENCOUNTER — Other Ambulatory Visit: Payer: Self-pay

## 2022-07-13 ENCOUNTER — Telehealth: Payer: Self-pay | Admitting: Physician Assistant

## 2022-07-13 NOTE — Telephone Encounter (Signed)
[facility] pharmacy is requesting prior authorization Key: [id number] Name: Brianna Villarreal Zepbound 2.5mg .0.7ml auto injectors

## 2022-07-13 NOTE — Telephone Encounter (Signed)
LOV 06/22/22 NOV none LRF 06/22/22 2 ml

## 2022-07-13 NOTE — Telephone Encounter (Signed)
[facility] pharmacy is requesting prescription refill tirzepatide (ZEPBOUND) 2.5 MG/0.5ML Pen  Please advise

## 2022-07-15 NOTE — Telephone Encounter (Signed)
Duplicate request do not continue

## 2022-07-15 NOTE — Telephone Encounter (Signed)
Sent to plan.

## 2022-07-24 ENCOUNTER — Other Ambulatory Visit: Payer: Self-pay | Admitting: Physician Assistant

## 2022-08-06 ENCOUNTER — Telehealth: Payer: Self-pay

## 2022-08-06 NOTE — Telephone Encounter (Signed)
PA not covering Wegovy or Zepbound.  Preferred medications; Benzphyetamine Diethylpropion  Phentermine

## 2022-08-07 ENCOUNTER — Encounter: Payer: Self-pay | Admitting: Family Medicine

## 2022-08-08 ENCOUNTER — Other Ambulatory Visit: Payer: Self-pay | Admitting: Family Medicine

## 2022-08-08 MED ORDER — TIRZEPATIDE-WEIGHT MANAGEMENT 5 MG/0.5ML ~~LOC~~ SOAJ: 5.0000 mg | SUBCUTANEOUS | 0 refills | Status: DC

## 2022-09-04 ENCOUNTER — Other Ambulatory Visit: Payer: Self-pay | Admitting: Family Medicine

## 2022-09-04 MED ORDER — TIRZEPATIDE-WEIGHT MANAGEMENT 7.5 MG/0.5ML ~~LOC~~ SOAJ: 7.5000 mg | SUBCUTANEOUS | 0 refills | Status: DC

## 2022-09-04 NOTE — Telephone Encounter (Signed)
Requested medication (s) are due for refill today - yes  Requested medication (s) are on the active medication list -yes  Future visit scheduled -no  Last refill: 08/08/22 2ml  Notes to clinic: off protocol- provider review   Requested Prescriptions  Pending Prescriptions Disp Refills   ZEPBOUND 5 MG/0.5ML Pen [Pharmacy Med Name: ZEPBOUND 5MG /0.5ML INJ (4 PF PENS)] 2 mL 0    Sig: ADMINISTER 5 MG UNDER THE SKIN 1 TIME A WEEK     Off-Protocol Failed - 09/04/2022  3:28 AM      Failed - Medication not assigned to a protocol, review manually.      Passed - Valid encounter within last 12 months    Recent Outpatient Visits           2 months ago Annual physical exam   [place] [facility] [name], [name]   4 months ago Acute bilateral low back pain without sciatica   [facility] Jacky Kindle, FNP   9 months ago Morbid obesity [facility])   [place] [facility] [name], [name]   1 year ago Morbid obesity [facility])   [place] [facility] [name], [name]   1 year ago Morbid obesity [facility])   [place] [facility] [name], [name]                 Requested Prescriptions  Pending Prescriptions Disp Refills   ZEPBOUND 5 MG/0.5ML Pen [Pharmacy Med Name: ZEPBOUND 5MG /0.5ML INJ (4 PF PENS)] 2 mL 0    Sig: ADMINISTER 5 MG UNDER THE SKIN 1 TIME A WEEK     Off-Protocol Failed - 09/04/2022  3:28 AM      Failed - Medication not assigned to a protocol, review manually.      Passed - Valid encounter within last 12 months    Recent Outpatient Visits           2 months ago Annual physical exam   [place] [facility] [name], [name]   4 months ago Acute bilateral low back pain without sciatica   [facility] Jacky Kindle, FNP   9 months ago Morbid obesity [facility])   [place] [facility] [name], [name]    1 year ago Morbid obesity [facility])   [place] [facility] [name], [name]   1 year ago Morbid obesity [facility])   [facility] [name], [place]

## 2022-10-04 ENCOUNTER — Other Ambulatory Visit: Payer: Self-pay | Admitting: Family Medicine

## 2022-10-05 NOTE — Telephone Encounter (Signed)
Requested medication (s) are due for refill today - yes  Requested medication (s) are on the active medication list -yes  Future visit scheduled -no  Last refill: 09/04/22 2ml  Notes to clinic: off protcol- provider review   Requested Prescriptions  Pending Prescriptions Disp Refills   ZEPBOUND 7.5 MG/0.5ML Pen [Pharmacy Med Name: ZEPBOUND 7.5MG /0.5ML INJ(4PF PENS)] 2 mL 0    Sig: ADMINISTER 7.5 MG UNDER THE SKIN 1 TIME A WEEK     Off-Protocol Failed - 10/04/2022  3:27 AM      Failed - Medication not assigned to a protocol, review manually.      Passed - Valid encounter within last 12 months    Recent Outpatient Visits           3 months ago Annual physical exam   [place] [facility] [name], [name]   6 months ago Acute bilateral low back pain without sciatica   [facility] Jacky Kindle, FNP   10 months ago Morbid obesity [facility])   [place] [facility] [name], [name]   1 year ago Morbid obesity [facility])   [place] [facility] [name], [name]   1 year ago Morbid obesity [facility])   [place] [facility] [name], [name]                 Requested Prescriptions  Pending Prescriptions Disp Refills   ZEPBOUND 7.5 MG/0.5ML Pen [Pharmacy Med Name: ZEPBOUND 7.5MG /0.5ML INJ(4PF PENS)] 2 mL 0    Sig: ADMINISTER 7.5 MG UNDER THE SKIN 1 TIME A WEEK     Off-Protocol Failed - 10/04/2022  3:27 AM      Failed - Medication not assigned to a protocol, review manually.      Passed - Valid encounter within last 12 months    Recent Outpatient Visits           3 months ago Annual physical exam   [place] [facility] [name], [name]   6 months ago Acute bilateral low back pain without sciatica   [facility] Jacky Kindle, FNP   10 months ago Morbid obesity [facility])   [place] [facility] [name], [name]   1 year ago Morbid obesity [facility])    [facility] [name], [name]   1 year ago Morbid obesity [facility])   [facility] [name], [place]

## 2022-10-26 ENCOUNTER — Encounter: Payer: Self-pay | Admitting: Family Medicine

## 2022-10-27 ENCOUNTER — Other Ambulatory Visit: Payer: Self-pay | Admitting: Family Medicine

## 2022-10-27 MED ORDER — TIRZEPATIDE-WEIGHT MANAGEMENT 10 MG/0.5ML ~~LOC~~ SOAJ: 10.0000 mg | SUBCUTANEOUS | 0 refills | Status: DC

## 2022-11-01 ENCOUNTER — Other Ambulatory Visit: Payer: Self-pay | Admitting: Family Medicine

## 2022-11-24 ENCOUNTER — Other Ambulatory Visit: Payer: Self-pay | Admitting: Family Medicine

## 2022-11-24 MED ORDER — TIRZEPATIDE-WEIGHT MANAGEMENT 12.5 MG/0.5ML ~~LOC~~ SOAJ: 12.5000 mg | SUBCUTANEOUS | 0 refills | Status: DC

## 2022-11-24 NOTE — Telephone Encounter (Signed)
Please advise 

## 2022-12-01 ENCOUNTER — Telehealth: Payer: Self-pay

## 2022-12-01 NOTE — Telephone Encounter (Signed)
Cover my meds Key [id number] Chestine Spore 10-Mar-1990

## 2022-12-09 NOTE — Telephone Encounter (Signed)
Archived on November 18 by Conrad [place] Status Unsent Drug Zepbound 2.5MG /0.5ML pen-injectors

## 2022-12-21 ENCOUNTER — Other Ambulatory Visit: Payer: Self-pay | Admitting: Family Medicine

## 2022-12-22 ENCOUNTER — Other Ambulatory Visit: Payer: Self-pay | Admitting: Family Medicine

## 2022-12-22 MED ORDER — TIRZEPATIDE-WEIGHT MANAGEMENT 12.5 MG/0.5ML ~~LOC~~ SOAJ: 12.5000 mg | SUBCUTANEOUS | 0 refills | Status: DC

## 2022-12-23 ENCOUNTER — Telehealth: Payer: Self-pay | Admitting: Family Medicine

## 2022-12-23 NOTE — Telephone Encounter (Signed)
Covermymeds is requesting prior authorization Key: [id number] Zepbound 2.5mg /0.33ml pen injectors

## 2022-12-24 ENCOUNTER — Encounter: Payer: Self-pay | Admitting: Family Medicine

## 2022-12-24 NOTE — Telephone Encounter (Signed)
Archived on December 18 by Conrad [place] in cover my meds.

## 2023-01-15 ENCOUNTER — Telehealth: Payer: Self-pay | Admitting: Family Medicine

## 2023-01-15 ENCOUNTER — Encounter: Payer: Self-pay | Admitting: Family Medicine

## 2023-01-15 ENCOUNTER — Ambulatory Visit: Payer: Managed Care, Other (non HMO) | Admitting: Family Medicine

## 2023-01-15 DIAGNOSIS — E7800 Pure hypercholesterolemia, unspecified: Secondary | ICD-10-CM

## 2023-01-15 DIAGNOSIS — F5101 Primary insomnia: Secondary | ICD-10-CM | POA: Diagnosis not present

## 2023-01-15 DIAGNOSIS — R7303 Prediabetes: Secondary | ICD-10-CM

## 2023-01-15 DIAGNOSIS — R7989 Other specified abnormal findings of blood chemistry: Secondary | ICD-10-CM

## 2023-01-15 DIAGNOSIS — Z23 Encounter for immunization: Secondary | ICD-10-CM | POA: Diagnosis not present

## 2023-01-15 DIAGNOSIS — E559 Vitamin D deficiency, unspecified: Secondary | ICD-10-CM

## 2023-01-15 DIAGNOSIS — D500 Iron deficiency anemia secondary to blood loss (chronic): Secondary | ICD-10-CM

## 2023-01-15 DIAGNOSIS — K219 Gastro-esophageal reflux disease without esophagitis: Secondary | ICD-10-CM

## 2023-01-15 MED ORDER — ZEPBOUND 15 MG/0.5ML ~~LOC~~ SOAJ: 15.0000 mg | SUBCUTANEOUS | 3 refills | Status: DC

## 2023-01-15 NOTE — Telephone Encounter (Signed)
 Please start PA for increased dose for obesity  Pt has been on zepbound since July 2024 and has lost 45 pounds with BMI >40 currently

## 2023-01-15 NOTE — Assessment & Plan Note (Signed)
 Obesity with a BMI of 44.06. Significant weight loss from 319 lbs in April 2024 to 289 lbs today. Currently on Zepbound  12.5 mg once weekly and requesting an increase to 15 mg once weekly. Discussed benefits of increased dosage for further weight loss and potential side effects such as nausea and injection site reactions. Explained need for regular monitoring of metabolic parameters. - Increase Zepbound  dose to 15 mg once weekly - Check complete metabolic panel - Check lipid panel - Check hemoglobin A1c - Check vitamin D  levels - Check iron and TIBC levels

## 2023-01-15 NOTE — Assessment & Plan Note (Signed)
 Monitoring required to assess current status. Discussed importance of lipid control in reducing cardiovascular risk. - Check lipid panel

## 2023-01-15 NOTE — Assessment & Plan Note (Signed)
 Chronic  Repeat CBC and iron, ferritin levels today  Has been on supplemental iron, PO

## 2023-01-15 NOTE — Assessment & Plan Note (Addendum)
 Monitoring required to assess current status. Discussed importance of lifestyle modifications and regular monitoring to prevent progression to diabetes.  - Check hemoglobin A1c

## 2023-01-15 NOTE — Telephone Encounter (Signed)
 Zepbound is not covered by patient plan per [facility].   Alternate drugs are Phentermine or  Diethylprop

## 2023-01-15 NOTE — Assessment & Plan Note (Signed)
 Monitoring required to assess current status. Discussed importance of iron levels and potential need for supplementation. Chronic  - Check iron and TIBC levels

## 2023-01-15 NOTE — Progress Notes (Signed)
 Established patient visit   Patient: Brianna Villarreal   DOB: August 23, 1990   33 y.o. Female  MRN: [id number] Visit Date: 01/15/2023  Today's healthcare provider: Rockie Agent, MD   Chief Complaint  Patient presents with   Medical Management of Chronic Issues   Weight Management Screening    Patient currently using zepbound  12.5   Subjective     HPI     Weight Management Screening    Additional comments: Patient currently using zepbound  12.5      Last edited by Lilian Fitzpatrick, CMA on 01/15/2023 12:39 PM.       Discussed the use of AI scribe software for clinical note transcription with the patient, who gave verbal consent to proceed.  History of Present Illness   The patient is a 33 year old female with a history of obesity, prediabetes, low vitamin D , elevated LDL, and anemia. She has been on a regimen of Zepbound  12.5 mg once weekly and has seen a significant weight loss from 319 pounds in April 2024 to 289 pounds at the time of the visit. The patient expressed a desire to increase the Zepbound  dosage to 15 mg once weekly.  The patient also reported a history of low iron levels, which have been a persistent issue. She has been taking a weekly dose of 50,000 units of vitamin D  to address her deficiency.  The patient has been working night shifts, which she reported as stressful and causing headaches. She also reported high blood pressure readings, which she attributed to the stress and lack of sleep from her work schedule.  The patient has a history of menstrual irregularities and has tried various treatments, including Janelle and Depo, without significant improvement. She reported a six-month period of continuous bleeding while on Depo. She has since stopped these treatments and reported that her menstrual cycle has been better without them.  The patient also reported severe allergies. 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 She recently switched to working in urinalysis, which she reported as less stressful.         Past Medical History:  Diagnosis Date   GERD (gastroesophageal reflux disease)    Insomnia     Medications: Outpatient Medications Prior to Visit  Medication Sig   albuterol  (VENTOLIN  HFA) 108 (90 Base) MCG/ACT inhaler Inhale 2 puffs into the lungs every 6 (six) hours as needed for wheezing or shortness of breath.   fluocinonide (LIDEX) 0.05 % external solution APPLY EXTERNALLY TO THE AFFECTED AREA OF SCALP EVERY DAY AS NEEDED FOR FLARES   ipratropium (ATROVENT ) 0.03 % nasal spray Place 2 sprays into both nostrils 3 (three) times daily as needed for rhinitis.   ketoconazole (NIZORAL) 2 % shampoo WASH SCALP AND FACE 3 TIMES A WEEK AS MAINTENANCE   levocetirizine (XYZAL ) 5 MG tablet Take 1 tablet (5 mg total) by mouth every evening.   ondansetron  (ZOFRAN ) 4 MG tablet Take 1 tablet (4 mg total) by mouth every 8 (eight) hours as needed for nausea or vomiting.   Vitamin D , Ergocalciferol , (DRISDOL ) 1.25 MG (50000 UNIT) CAPS capsule Take 1 capsule (50,000 Units total) by mouth every 7 (seven) days.   [DISCONTINUED] tirzepatide  (ZEPBOUND ) 12.5 MG/0.5ML Pen Inject 12.5 mg into the skin once a week.   meloxicam  (MOBIC ) 15 MG tablet TAKE 1 TABLET(15 MG) BY MOUTH DAILY (Patient not taking: Reported on 01/15/2023)   methocarbamol  (ROBAXIN ) 500  MG tablet Take 1 tablet (500 mg total) by mouth 4 (four) times daily. (Patient not taking: Reported on 01/15/2023)   predniSONE  (DELTASONE ) 50 MG tablet Take 1 tablet (50 mg total) by mouth daily with breakfast. (Patient not taking: Reported on 01/15/2023)   traZODone  (DESYREL ) 50 MG tablet TAKE 1/2 TO 1 TABLET(25 TO 50 MG) BY MOUTH AT BEDTIME AS NEEDED FOR SLEEP (Patient not taking: Reported on 01/15/2023)   No facility-administered medications prior to visit.    Review of Systems  Last CBC Lab Results   Component Value Date   WBC 8.5 04/08/2022   HGB 11.9 04/08/2022   HCT 38.6 04/08/2022   MCV 70 (L) 04/08/2022   MCH 21.6 (L) 04/08/2022   RDW 17.4 (H) 04/08/2022   PLT 338 04/08/2022   Last metabolic panel Lab Results  Component Value Date   GLUCOSE 116 (H) 04/08/2022   NA 138 04/08/2022   K 4.4 04/08/2022   CL 107 (H) 04/08/2022   CO2 21 04/08/2022   BUN 8 04/08/2022   CREATININE 0.70 04/08/2022   EGFR 117 04/08/2022   CALCIUM 9.1 04/08/2022   PROT 6.7 11/24/2021   ALBUMIN 3.9 11/24/2021   LABGLOB 2.8 11/24/2021   AGRATIO 1.4 11/24/2021   BILITOT <0.2 11/24/2021   ALKPHOS 85 11/24/2021   AST 19 11/24/2021   ALT 14 11/24/2021   ANIONGAP 6 08/08/2015   Last lipids Lab Results  Component Value Date   CHOL 177 04/08/2022   HDL 38 (L) 04/08/2022   LDLCALC 95 04/08/2022   TRIG 260 (H) 04/08/2022   CHOLHDL 4.7 (H) 04/08/2022   Last hemoglobin A1c Lab Results  Component Value Date   HGBA1C 5.8 (H) 04/08/2022   Last thyroid functions Lab Results  Component Value Date   TSH 1.180 02/21/2021   Last vitamin D  Lab Results  Component Value Date   VD25OH 13.3 (L) 04/08/2022   Last vitamin B12 and Folate Lab Results  Component Value Date   VITAMINB12 715 04/08/2022   FOLATE 9.3 04/08/2022        Objective    BP 132/61 (BP Location: Right Arm, Patient Position: Sitting, Cuff Size: Normal)   Pulse 76   Ht 5' 8 (1.727 m)   Wt 289 lb 12.8 oz (131.5 kg)   SpO2 100%   BMI 44.06 kg/m  BP Readings from Last 3 Encounters:  01/15/23 132/61  06/22/22 127/78  04/08/22 110/68   Wt Readings from Last 3 Encounters:  01/15/23 289 lb 12.8 oz (131.5 kg)  06/22/22 (!) 313 lb 1.6 oz (142 kg)  04/08/22 (!) 319 lb (144.7 kg)        Physical Exam  General: Alert, no acute distress Cardio: Normal S1 and S2, RRR, no r/m/g Pulm: CTAB, normal work of breathing Abdomen: Bowel sounds normal. Abdomen soft and non-tender.    No results found for any visits on  01/15/23.  Assessment & Plan     Problem List Items Addressed This Visit       Digestive   GERD (gastroesophageal reflux disease)   Relevant Orders   VITAMIN D  25 Hydroxy (Vit-D Deficiency, Fractures)     Other   Primary insomnia   Prediabetes   Monitoring required to assess current status. Discussed importance of lifestyle modifications and regular monitoring to prevent progression to diabetes.  - Check hemoglobin A1c      Relevant Orders   Hemoglobin A1c   Morbid obesity (HCC) - Primary   Obesity with a BMI  of 44.06. Significant weight loss from 319 lbs in April 2024 to 289 lbs today. Currently on Zepbound  12.5 mg once weekly and requesting an increase to 15 mg once weekly. Discussed benefits of increased dosage for further weight loss and potential side effects such as nausea and injection site reactions. Explained need for regular monitoring of metabolic parameters. - Increase Zepbound  dose to 15 mg once weekly - Check complete metabolic panel - Check lipid panel - Check hemoglobin A1c - Check vitamin D  levels - Check iron and TIBC levels      Relevant Medications   tirzepatide  (ZEPBOUND ) 15 MG/0.5ML Pen   Other Relevant Orders   Hemoglobin A1c   Comprehensive metabolic panel   Iron deficiency anemia due to chronic blood loss   Chronic  Repeat CBC and iron, ferritin levels today  Has been on supplemental iron, PO      Relevant Orders   Iron, TIBC and Ferritin Panel   Elevated LDL cholesterol level   Monitoring required to assess current status. Discussed importance of lipid control in reducing cardiovascular risk. - Check lipid panel      Relevant Orders   Lipid Panel With LDL/HDL Ratio   Abnormal CBC   Monitoring required to assess current status. Discussed importance of iron levels and potential need for supplementation. Chronic  - Check iron and TIBC levels      Relevant Orders   Iron, TIBC and Ferritin Panel   Other Visit Diagnoses       Vitamin  D deficiency         Influenza vaccine needed       Relevant Orders   Flu vaccine trivalent PF, 6mos and older(Flulaval,Afluria,Fluarix,Fluzone) (Completed)             Vitamin D  Deficiency Currently on vitamin D  50,000 IU once weekly. Discussed importance of maintaining adequate vitamin D  levels for bone health and overall well-being. Last level 13 in 04/2022 - Check vitamin D  levels   General Health Maintenance Requires routine immunization. Discussed benefits of flu vaccine in preventing influenza and its complications. - Administer flu vaccine today          Return in about 3 months (around 04/15/2023) for CPE.         Rockie Agent, MD  [facility] [phone] (phone) [phone] (fax)  [facility]

## 2023-01-16 LAB — VITAMIN D 25 HYDROXY (VIT D DEFICIENCY, FRACTURES): Vit D, 25-Hydroxy: 18.1 ng/mL — ABNORMAL LOW (ref 30.0–100.0)

## 2023-01-16 LAB — COMPREHENSIVE METABOLIC PANEL
ALT: 13 [IU]/L (ref 0–32)
AST: 18 [IU]/L (ref 0–40)
Albumin: 4.3 g/dL (ref 3.9–4.9)
Alkaline Phosphatase: 85 [IU]/L (ref 44–121)
BUN/Creatinine Ratio: 9 (ref 9–23)
BUN: 7 mg/dL (ref 6–20)
Bilirubin Total: 0.3 mg/dL (ref 0.0–1.2)
CO2: 22 mmol/L (ref 20–29)
Calcium: 9.8 mg/dL (ref 8.7–10.2)
Chloride: 103 mmol/L (ref 96–106)
Creatinine, Ser: 0.8 mg/dL (ref 0.57–1.00)
Globulin, Total: 3.1 g/dL (ref 1.5–4.5)
Glucose: 80 mg/dL (ref 70–99)
Potassium: 4.3 mmol/L (ref 3.5–5.2)
Sodium: 140 mmol/L (ref 134–144)
Total Protein: 7.4 g/dL (ref 6.0–8.5)
eGFR: 100 mL/min/{1.73_m2} (ref >59)

## 2023-01-16 LAB — LIPID PANEL WITH LDL/HDL RATIO
Cholesterol, Total: 190 mg/dL (ref 100–199)
HDL: 42 mg/dL (ref >39)
LDL Chol Calc (NIH): 130 mg/dL — ABNORMAL HIGH (ref 0–99)
LDL/HDL Ratio: 3.1 {ratio} (ref 0.0–3.2)
Triglycerides: 101 mg/dL (ref 0–149)
VLDL Cholesterol Cal: 18 mg/dL (ref 5–40)

## 2023-01-16 LAB — HEMOGLOBIN A1C
Est. average glucose Bld gHb Est-mCnc: 117 mg/dL
Hgb A1c MFr Bld: 5.7 % — ABNORMAL HIGH (ref 4.8–5.6)

## 2023-01-16 LAB — IRON,TIBC AND FERRITIN PANEL
Ferritin: 48 ng/mL (ref 15–150)
Iron Saturation: 6 % — CL (ref 15–55)
Iron: 22 ug/dL — ABNORMAL LOW (ref 27–159)
Total Iron Binding Capacity: 392 ug/dL (ref 250–450)
UIBC: 370 ug/dL (ref 131–425)

## 2023-01-20 ENCOUNTER — Encounter: Payer: Self-pay | Admitting: Family Medicine

## 2023-01-20 ENCOUNTER — Other Ambulatory Visit: Payer: Self-pay | Admitting: Family Medicine

## 2023-01-20 DIAGNOSIS — E559 Vitamin D deficiency, unspecified: Secondary | ICD-10-CM

## 2023-01-20 MED ORDER — VITAMIN D (ERGOCALCIFEROL) 1.25 MG (50000 UNIT) PO CAPS: 50000.0000 [IU] | ORAL_CAPSULE | ORAL | 0 refills | Status: DC

## 2023-01-28 NOTE — Telephone Encounter (Signed)
Patient called to get an update on the prior authorization for her Zepbound. Please advise.

## 2023-02-05 ENCOUNTER — Telehealth: Payer: Self-pay | Admitting: Family Medicine

## 2023-02-05 NOTE — Telephone Encounter (Signed)
Pt is calling in checking on the status of her Prior Authorization for Zepbound. Pt says she was supposed to receive an update last week and hasn't heard anything back from the office. Please follow up with pt.

## 2023-02-08 NOTE — Telephone Encounter (Signed)
Recieved a fax from covermymeds for Zepbound 12.5MG /0.5 ML Auto injectors. PA has started   key: [id number]

## 2023-02-09 NOTE — Telephone Encounter (Signed)
 Duplicate

## 2023-02-10 ENCOUNTER — Telehealth: Payer: Self-pay | Admitting: Family Medicine

## 2023-02-10 NOTE — Telephone Encounter (Signed)
 [facility] pharmacy is requesting refill tirzepatide  (ZEPBOUND ) 15 MG/0.5ML Pen   Please advise

## 2023-02-10 NOTE — Telephone Encounter (Signed)
 Zepbound  prescription sent on 01/15/23 for one month supply with 3 refills. Too soon to refill.

## 2023-02-18 ENCOUNTER — Telehealth: Payer: Self-pay | Admitting: Family Medicine

## 2023-02-18 NOTE — Telephone Encounter (Signed)
[facility] pharmacy is requesting prior authorization Key: [id number] Zepbound 12.5MG /0.5ML auto injectors

## 2023-02-19 ENCOUNTER — Other Ambulatory Visit (HOSPITAL_COMMUNITY): Payer: Self-pay

## 2023-02-23 ENCOUNTER — Other Ambulatory Visit (HOSPITAL_COMMUNITY): Payer: Self-pay

## 2023-05-17 ENCOUNTER — Other Ambulatory Visit: Payer: Self-pay | Admitting: Family Medicine

## 2023-07-05 ENCOUNTER — Encounter: Payer: Self-pay | Admitting: Family Medicine

## 2023-07-05 ENCOUNTER — Ambulatory Visit (INDEPENDENT_AMBULATORY_CARE_PROVIDER_SITE_OTHER): Payer: Self-pay | Admitting: Family Medicine

## 2023-07-05 VITALS — BP 122/80 | HR 83 | Ht 68.0 in | Wt 278.0 lb

## 2023-07-05 DIAGNOSIS — N920 Excessive and frequent menstruation with regular cycle: Secondary | ICD-10-CM | POA: Diagnosis not present

## 2023-07-05 DIAGNOSIS — Z13220 Encounter for screening for lipoid disorders: Secondary | ICD-10-CM

## 2023-07-05 DIAGNOSIS — E559 Vitamin D deficiency, unspecified: Secondary | ICD-10-CM

## 2023-07-05 DIAGNOSIS — F5101 Primary insomnia: Secondary | ICD-10-CM | POA: Diagnosis not present

## 2023-07-05 DIAGNOSIS — Z0000 Encounter for general adult medical examination without abnormal findings: Secondary | ICD-10-CM | POA: Diagnosis not present

## 2023-07-05 DIAGNOSIS — N946 Dysmenorrhea, unspecified: Secondary | ICD-10-CM

## 2023-07-05 DIAGNOSIS — E7800 Pure hypercholesterolemia, unspecified: Secondary | ICD-10-CM | POA: Diagnosis not present

## 2023-07-05 DIAGNOSIS — Z23 Encounter for immunization: Secondary | ICD-10-CM | POA: Diagnosis not present

## 2023-07-05 DIAGNOSIS — Z131 Encounter for screening for diabetes mellitus: Secondary | ICD-10-CM

## 2023-07-05 DIAGNOSIS — D500 Iron deficiency anemia secondary to blood loss (chronic): Secondary | ICD-10-CM

## 2023-07-05 NOTE — Progress Notes (Signed)
 "    Complete physical exam   Patient: Brianna Villarreal   DOB: 08/16/1990   33 y.o. Female  MRN: [id number] Visit Date: 07/05/2023  Today's healthcare provider: Rockie Agent, MD   Chief Complaint  Patient presents with   Care Management    Pattern of eating:General   Sleep:Normal  Are you exercising Yes  What type of exercising: Cardo and lift weights  How long: 30 min to an hour  How frequent: 3 times a week   Vaccine: Agreed      Subjective    Brianna Villarreal is a 33 y.o. female who presents today for a complete physical exam.    Discussed the use of AI scribe software for clinical note transcription with the patient, who gave verbal consent to proceed.  History of Present Illness Brianna Villarreal is a 33 year old female who presents for an annual physical exam.  She has been experiencing worsening menstrual cycles, with the first two to three days being particularly severe. She experiences heavy bleeding and cramping, which radiates to her back and causes significant pain, sometimes leading to nausea. This has impacted her ability to work, as she has missed work during her last two cycles. She previously saw a gynecologist who suggested the possibility of fibroids, but no further diagnostic work was done. She was given Depo-Provera , which led to prolonged bleeding, and oral contraceptive pills, which have not been effective.  She is due for her third dose of the HPV vaccine. Her last Pap smear in 2029 was negative for HPV and intraepithelial lesions. She is up to date with hepatitis B vaccinations, hepatitis C and HIV screenings, and is recommended for an updated COVID vaccine. She is not yet of age for mammograms or colonoscopy.  She has a history of tendinosis in her right arm, previously diagnosed as carpal tunnel syndrome and tennis elbow. She underwent surgery in 2019 and completed physical therapy, but symptoms recur with work-related activities. 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  She has been taking vitamin D  supplements, although she reports receiving partial refills. She engages in regular physical activity, including cardio and weightlifting three times a week, and follows a general diet. She has lost a significant amount of weight, approximately 56 pounds, since starting her exercise regimen.     Past Medical History:  Diagnosis Date   GERD (gastroesophageal reflux disease)    Insomnia    Past Surgical History:  Procedure Laterality Date   NO PAST SURGERIES     Social History   Socioeconomic History   Marital status: Single    Spouse name: Not on file   Number of children: Not on file   Years of education: Not on file   Highest education level: Bachelor's degree (e.g., BA, AB, BS)  Occupational History   Not on file  Tobacco Use   Smoking status: Never   Smokeless tobacco: Never  Vaping Use   Vaping status: Never Used  Substance and Sexual Activity   Alcohol use: Yes    Alcohol/week: 0.0 standard drinks of alcohol    Comment: occasional   Drug use: No   Sexual activity: Not Currently    Birth control/protection: Condom  Other Topics Concern   Not on file  Social History Narrative   Not on file   Social Drivers of Health   Financial Resource Strain: Low Risk  (07/04/2023)   Overall Financial Resource Strain (CARDIA)  Difficulty of Paying Living Expenses: Not hard at all  Food Insecurity: No Food Insecurity (07/04/2023)   Hunger Vital Sign    Worried About Running Out of Food in the Last Year: Never true    Ran Out of Food in the Last Year: Never true  Transportation Needs: No Transportation Needs (07/04/2023)   PRAPARE - Administrator, Civil Service (Medical): No    Lack of Transportation (Non-Medical): No  Physical Activity: Insufficiently Active (07/04/2023)   Exercise Vital Sign    Days of Exercise per Week: 3 days    Minutes of Exercise per Session:  30 min  Stress: No Stress Concern Present (07/04/2023)   [employer] of Occupational Health - Occupational Stress Questionnaire    Feeling of Stress: Not at all  Social Connections: Unknown (07/04/2023)   Social Connection and Isolation Panel    Frequency of Communication with Friends and Family: More than three times a week    Frequency of Social Gatherings with Friends and Family: Once a week    Attends Religious Services: 1 to 4 times per year    Active Member of [employer] or Organizations: Not on file    Attends Banker Meetings: Not on file    Marital Status: Never married  Intimate Partner Violence: Not At Risk (07/05/2023)   Humiliation, Afraid, Rape, and Kick questionnaire    Fear of Current or Ex-Partner: No    Emotionally Abused: No    Physically Abused: No    Sexually Abused: No   Family Status  Relation Name Status   Mother  Alive   Father  Alive   Brother  Alive   MGM  Deceased   MGF  Deceased   PGM  Deceased   PGF  Deceased  No partnership data on file   Family History  Problem Relation Age of Onset   Hypertension Mother    Allergies  Allergen Reactions   Latex Hives, Itching and Swelling     Medications: Outpatient Medications Prior to Visit  Medication Sig   ZEPBOUND  12.5 MG/0.5ML Pen SMARTSIG:12.5 Milligram(s) SUB-Q Once a Week   albuterol  (VENTOLIN  HFA) 108 (90 Base) MCG/ACT inhaler Inhale 2 puffs into the lungs every 6 (six) hours as needed for wheezing or shortness of breath.   fluocinonide (LIDEX) 0.05 % external solution APPLY EXTERNALLY TO THE AFFECTED AREA OF SCALP EVERY DAY AS NEEDED FOR FLARES   ipratropium (ATROVENT ) 0.03 % nasal spray Place 2 sprays into both nostrils 3 (three) times daily as needed for rhinitis.   ketoconazole (NIZORAL) 2 % shampoo WASH SCALP AND FACE 3 TIMES A WEEK AS MAINTENANCE   levocetirizine (XYZAL ) 5 MG tablet Take 1 tablet (5 mg total) by mouth every evening.   meloxicam  (MOBIC ) 15 MG tablet TAKE 1  TABLET(15 MG) BY MOUTH DAILY (Patient not taking: Reported on 07/05/2023)   methocarbamol  (ROBAXIN ) 500 MG tablet Take 1 tablet (500 mg total) by mouth 4 (four) times daily. (Patient not taking: Reported on 07/05/2023)   ondansetron  (ZOFRAN ) 4 MG tablet Take 1 tablet (4 mg total) by mouth every 8 (eight) hours as needed for nausea or vomiting.   tirzepatide  (ZEPBOUND ) 15 MG/0.5ML Pen Inject 15 mg into the skin once a week.   traZODone  (DESYREL ) 50 MG tablet TAKE 1/2 TO 1 TABLET(25 TO 50 MG) BY MOUTH AT BEDTIME AS NEEDED FOR SLEEP (Patient not taking: Reported on 07/05/2023)   Vitamin D , Ergocalciferol , (DRISDOL ) 1.25 MG (50000 UNIT) CAPS capsule Take  1 capsule (50,000 Units total) by mouth every 7 (seven) days.   No facility-administered medications prior to visit.    Review of Systems  Last CBC Lab Results  Component Value Date   WBC 8.9 07/05/2023   HGB 13.5 07/05/2023   HCT 44.1 07/05/2023   MCV 79 07/05/2023   MCH 24.3 (L) 07/05/2023   RDW 14.7 07/05/2023   PLT 302 07/05/2023   Last metabolic panel Lab Results  Component Value Date   GLUCOSE 80 07/05/2023   NA 137 07/05/2023   K 5.1 07/05/2023   CL 100 07/05/2023   CO2 22 07/05/2023   BUN 10 07/05/2023   CREATININE 1.00 07/05/2023   EGFR 76 07/05/2023   CALCIUM 10.0 07/05/2023   PROT 7.2 07/05/2023   ALBUMIN 4.3 07/05/2023   LABGLOB 2.9 07/05/2023   AGRATIO 1.4 11/24/2021   BILITOT <0.2 07/05/2023   ALKPHOS 81 07/05/2023   AST 19 07/05/2023   ALT 10 07/05/2023   ANIONGAP 6 08/08/2015   Last lipids Lab Results  Component Value Date   CHOL 186 07/05/2023   HDL 43 07/05/2023   LDLCALC 115 (H) 07/05/2023   TRIG 156 (H) 07/05/2023   CHOLHDL 4.3 07/05/2023   Last hemoglobin A1c Lab Results  Component Value Date   HGBA1C 5.7 (H) 07/05/2023   Last thyroid functions Lab Results  Component Value Date   TSH 1.180 02/21/2021   Last vitamin D  Lab Results  Component Value Date   VD25OH 26.4 (L) 07/05/2023    Last vitamin B12 and Folate Lab Results  Component Value Date   VITAMINB12 715 04/08/2022   FOLATE 9.3 04/08/2022       Objective    BP 122/80   Pulse 83   Ht 5' 8 (1.727 m)   Wt 278 lb (126.1 kg)   SpO2 100%   BMI 42.27 kg/m  BP Readings from Last 3 Encounters:  07/05/23 122/80  01/15/23 132/61  06/22/22 127/78   Wt Readings from Last 3 Encounters:  07/05/23 278 lb (126.1 kg)  01/15/23 289 lb 12.8 oz (131.5 kg)  06/22/22 (!) 313 lb 1.6 oz (142 kg)    Physical Exam Vitals reviewed.  Constitutional:      General: She is not in acute distress.    Appearance: Normal appearance. She is not ill-appearing, toxic-appearing or diaphoretic.  HENT:     Head: Normocephalic and atraumatic.     Right Ear: Tympanic membrane and external ear normal. There is no impacted cerumen.     Left Ear: Tympanic membrane and external ear normal. There is no impacted cerumen.     Nose: Nose normal.     Mouth/Throat:     Pharynx: Oropharynx is clear.  Eyes:     General: No scleral icterus.    Extraocular Movements: Extraocular movements intact.     Conjunctiva/sclera: Conjunctivae normal.     Pupils: Pupils are equal, round, and reactive to light.  Cardiovascular:     Rate and Rhythm: Normal rate and regular rhythm.     Pulses: Normal pulses.     Heart sounds: Normal heart sounds. No murmur heard.    No friction rub. No gallop.  Pulmonary:     Effort: Pulmonary effort is normal. No respiratory distress.     Breath sounds: Normal breath sounds. No wheezing, rhonchi or rales.  Abdominal:     General: Bowel sounds are normal. There is no distension.     Palpations: Abdomen is soft. There is no mass.  Tenderness: There is no abdominal tenderness. There is no guarding.  Musculoskeletal:        General: No deformity.     Cervical back: Normal range of motion and neck supple.     Right lower leg: No edema.     Left lower leg: No edema.  Lymphadenopathy:     Cervical: No cervical  adenopathy.  Skin:    General: Skin is warm.     Capillary Refill: Capillary refill takes less than 2 seconds.     Findings: No erythema or rash.  Neurological:     General: No focal deficit present.     Mental Status: She is alert and oriented to person, place, and time.     Cranial Nerves: Cranial nerves 2-12 are intact. No cranial nerve deficit or facial asymmetry.     Motor: Motor function is intact. No weakness.     Gait: Gait normal.  Psychiatric:        Mood and Affect: Mood normal.        Behavior: Behavior normal.       Last depression screening scores    07/05/2023    3:12 PM 06/22/2022   10:43 AM 04/08/2022    3:16 PM  PHQ 2/9 Scores  PHQ - 2 Score 0 0 0  PHQ- 9 Score 2 2 2     Last fall risk screening    06/22/2022   10:43 AM  Fall Risk   Falls in the past year? 0  Number falls in past yr: 0  Injury with Fall? 0  Risk for fall due to : No Fall Risks  Follow up Falls evaluation completed    Last Audit-C alcohol use screening    07/04/2023    3:20 PM  Alcohol Use Disorder Test (AUDIT)  1. How often do you have a drink containing alcohol? 1  2. How many drinks containing alcohol do you have on a typical day when you are drinking? 0  3. How often do you have six or more drinks on one occasion? 0  AUDIT-C Score 1      Patient-reported   A score of 3 or more in women, and 4 or more in men indicates increased risk for alcohol abuse, EXCEPT if all of the points are from question 1   Results for orders placed or performed in visit on 07/05/23  Hemoglobin A1c  Result Value Ref Range   Hgb A1c MFr Bld 5.7 (H) 4.8 - 5.6 %   Est. average glucose Bld gHb Est-mCnc 117 mg/dL  RFE85+ZHQM  Result Value Ref Range   Glucose 80 70 - 99 mg/dL   BUN 10 6 - 20 mg/dL   Creatinine, Ser 8.99 0.57 - 1.00 mg/dL   eGFR 76 >40 fO/fpw/8.26   BUN/Creatinine Ratio 10 9 - 23   Sodium 137 134 - 144 mmol/L   Potassium 5.1 3.5 - 5.2 mmol/L   Chloride 100 96 - 106 mmol/L   CO2 22  20 - 29 mmol/L   Calcium 10.0 8.7 - 10.2 mg/dL   Total Protein 7.2 6.0 - 8.5 g/dL   Albumin 4.3 3.9 - 4.9 g/dL   Globulin, Total 2.9 1.5 - 4.5 g/dL   Bilirubin Total <9.7 0.0 - 1.2 mg/dL   Alkaline Phosphatase 81 44 - 121 IU/L   AST 19 0 - 40 IU/L   ALT 10 0 - 32 IU/L  Lipid panel  Result Value Ref Range   Cholesterol, Total 186 100 -  199 mg/dL   Triglycerides 843 (H) 0 - 149 mg/dL   HDL 43 >60 mg/dL   VLDL Cholesterol Cal 28 5 - 40 mg/dL   LDL Chol Calc (NIH) 884 (H) 0 - 99 mg/dL   Chol/HDL Ratio 4.3 0.0 - 4.4 ratio  CBC  Result Value Ref Range   WBC 8.9 3.4 - 10.8 x10E3/uL   RBC 5.56 (H) 3.77 - 5.28 x10E6/uL   Hemoglobin 13.5 11.1 - 15.9 g/dL   Hematocrit 55.8 65.9 - 46.6 %   MCV 79 79 - 97 fL   MCH 24.3 (L) 26.6 - 33.0 pg   MCHC 30.6 (L) 31.5 - 35.7 g/dL   RDW 85.2 88.2 - 84.5 %   Platelets 302 150 - 450 x10E3/uL  VITAMIN D  25 Hydroxy (Vit-D Deficiency, Fractures)  Result Value Ref Range   Vit D, 25-Hydroxy 26.4 (L) 30.0 - 100.0 ng/mL    Assessment & Plan    Routine Health Maintenance and Physical Exam  Immunization History  Administered Date(s) Administered   DTaP 05/12/1989, 08/04/1989, 09/27/1989, 10/27/1990, 03/17/1993   HIB (PRP-OMP) 05/12/1989, 08/04/1989, 09/27/1989, 07/27/1990   HPV Quadrivalent 09/24/2006, 11/26/2006   Hepatitis A, Adult 10/21/2016   Hepatitis B 10/27/1990, 12/06/1990, 08/31/1991   IPV 05/12/1989, 05/12/1989, 08/04/1989, 09/27/1989, 10/27/1990, 03/17/1993   Influenza, Seasonal, Injecte, Preservative Fre 01/15/2023   Influenza,inj,Quad PF,6+ Mos 09/04/2015, 10/21/2016, 10/19/2017, 11/15/2020, 11/24/2021   MMR 02/26/1990, 02/26/1990, 03/17/1993, 07/20/2007   Meningococcal Conjugate 09/24/2006   Td 08/20/2016   Tdap 09/24/2006    Health Maintenance  Topic Date Due   HPV VACCINES (3 - 3-dose series) 03/24/2007   COVID-19 Vaccine (1 - 2024-25 season) Never done   INFLUENZA VACCINE  08/06/2023   Cervical Cancer Screening (HPV/Pap Cotest)   06/06/2024   DTaP/Tdap/Td (8 - Td or Tdap) 08/21/2026   Hepatitis B Vaccines  Completed   Hepatitis C Screening  Completed   HIV Screening  Completed   Meningococcal B Vaccine  Aged Out    Problem List Items Addressed This Visit       Other   Primary insomnia   Relevant Orders   CMP14+EGFR (Completed)   Morbid obesity (HCC)   Relevant Medications   ZEPBOUND  12.5 MG/0.5ML Pen   Other Relevant Orders   Hemoglobin A1c (Completed)   CMP14+EGFR (Completed)   Lipid panel (Completed)   Menorrhagia with regular cycle   Relevant Orders   Ambulatory referral to Gynecology   Iron deficiency anemia due to chronic blood loss   Relevant Orders   CBC (Completed)   Elevated LDL cholesterol level   Annual physical exam - Primary   Up to date on hepatitis B, hepatitis C, and HIV vaccinations and screenings. Due for third HPV vaccine dose and updated COVID vaccine. Not yet of age for mammograms or colonoscopy. Last Pap smear in 2029 was negative for HPV and intraepithelial lesions; next due in 2026. Taking vitamin D  supplements with refill issues. - recommended third dose of HPV vaccine. She was unable to receive dose today  - Recommend updated COVID vaccine. - Order A1c, CMP, cholesterol panel, and hemoglobin levels to screen for diabetes, cholesterol, and anemia. - Ensure vitamin D  supplementation is adequate and address refill issues. - recommended pt continue diet and exercise efforts, congratulated her on losing >50 pounds for weight loss goals      Other Visit Diagnoses       Need for HPV vaccine         Dysmenorrhea  Relevant Orders   Ambulatory referral to Gynecology     Screening for lipid disorders         Screening for diabetes mellitus       Relevant Orders   Hemoglobin A1c (Completed)     Vitamin D  deficiency       Relevant Orders   VITAMIN D  25 Hydroxy (Vit-D Deficiency, Fractures) (Completed)        Assessment & Plan Menorrhagia and Dysmenorrhea Worsening  menstrual cycles with heavy bleeding and severe cramping, particularly during the first three days, leading to nausea and impacting work ability. Previous treatments with Depo-Provera  and oral contraceptive pills were ineffective. Possible fibroid history, but no recent imaging or evaluation. - Refer to gynecology for further evaluation and management.  Tendinosis of the Right Arm Tendinosis in the right arm with previous diagnoses of carpal tunnel syndrome and tennis elbow. 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 Completed physical therapy, but symptoms persist. - Advise to contact orthopedic specialist to discuss potential need for repeat surgery or further management.   Follow-up Requires follow-up to review lab results and ensure ongoing management of her conditions. - Schedule follow-up appointment in four months to review lab results and monitor weight management progress.       Return in about 4 months (around 11/04/2023) for Weight MGMT.       Rockie Agent, MD  [facility] [phone] (phone) [phone] (fax)  [facility] "

## 2023-07-05 NOTE — Assessment & Plan Note (Signed)
 Up to date on hepatitis B, hepatitis C, and HIV vaccinations and screenings. Due for third HPV vaccine dose and updated COVID vaccine. Not yet of age for mammograms or colonoscopy. Last Pap smear in 2029 was negative for HPV and intraepithelial lesions; next due in 2026. Taking vitamin D  supplements with refill issues. - recommended third dose of HPV vaccine. She was unable to receive dose today  - Recommend updated COVID vaccine. - Order A1c, CMP, cholesterol panel, and hemoglobin levels to screen for diabetes, cholesterol, and anemia. - Ensure vitamin D  supplementation is adequate and address refill issues. - recommended pt continue diet and exercise efforts, congratulated her on losing >50 pounds for weight loss goals

## 2023-07-06 ENCOUNTER — Ambulatory Visit: Payer: Self-pay | Admitting: Family Medicine

## 2023-07-06 LAB — CBC
Hematocrit: 44.1 % (ref 34.0–46.6)
Hemoglobin: 13.5 g/dL (ref 11.1–15.9)
MCH: 24.3 pg — ABNORMAL LOW (ref 26.6–33.0)
MCHC: 30.6 g/dL — ABNORMAL LOW (ref 31.5–35.7)
MCV: 79 fL (ref 79–97)
Platelets: 302 10*3/uL (ref 150–450)
RBC: 5.56 x10E6/uL — ABNORMAL HIGH (ref 3.77–5.28)
RDW: 14.7 % (ref 11.7–15.4)
WBC: 8.9 10*3/uL (ref 3.4–10.8)

## 2023-07-06 LAB — CMP14+EGFR
ALT: 10 IU/L (ref 0–32)
AST: 19 IU/L (ref 0–40)
Albumin: 4.3 g/dL (ref 3.9–4.9)
Alkaline Phosphatase: 81 IU/L (ref 44–121)
BUN/Creatinine Ratio: 10 (ref 9–23)
BUN: 10 mg/dL (ref 6–20)
Bilirubin Total: <0.2 mg/dL (ref 0.0–1.2)
CO2: 22 mmol/L (ref 20–29)
Calcium: 10 mg/dL (ref 8.7–10.2)
Chloride: 100 mmol/L (ref 96–106)
Creatinine, Ser: 1 mg/dL (ref 0.57–1.00)
Globulin, Total: 2.9 g/dL (ref 1.5–4.5)
Glucose: 80 mg/dL (ref 70–99)
Potassium: 5.1 mmol/L (ref 3.5–5.2)
Sodium: 137 mmol/L (ref 134–144)
Total Protein: 7.2 g/dL (ref 6.0–8.5)
eGFR: 76 mL/min/{1.73_m2} (ref >59)

## 2023-07-06 LAB — VITAMIN D 25 HYDROXY (VIT D DEFICIENCY, FRACTURES): Vit D, 25-Hydroxy: 26.4 ng/mL — ABNORMAL LOW (ref 30.0–100.0)

## 2023-07-06 LAB — LIPID PANEL
Chol/HDL Ratio: 4.3 ratio (ref 0.0–4.4)
Cholesterol, Total: 186 mg/dL (ref 100–199)
HDL: 43 mg/dL (ref >39)
LDL Chol Calc (NIH): 115 mg/dL — ABNORMAL HIGH (ref 0–99)
Triglycerides: 156 mg/dL — ABNORMAL HIGH (ref 0–149)
VLDL Cholesterol Cal: 28 mg/dL (ref 5–40)

## 2023-07-06 LAB — HEMOGLOBIN A1C
Est. average glucose Bld gHb Est-mCnc: 117 mg/dL
Hgb A1c MFr Bld: 5.7 % — ABNORMAL HIGH (ref 4.8–5.6)

## 2023-08-16 ENCOUNTER — Encounter: Payer: Self-pay | Admitting: Obstetrics and Gynecology

## 2023-08-16 ENCOUNTER — Ambulatory Visit: Admitting: Obstetrics and Gynecology

## 2023-08-16 VITALS — BP 128/88 | HR 80 | Ht 68.0 in | Wt 285.3 lb

## 2023-08-16 DIAGNOSIS — N920 Excessive and frequent menstruation with regular cycle: Secondary | ICD-10-CM | POA: Diagnosis not present

## 2023-08-16 DIAGNOSIS — N946 Dysmenorrhea, unspecified: Secondary | ICD-10-CM

## 2023-08-16 DIAGNOSIS — Z7689 Persons encountering health services in other specified circumstances: Secondary | ICD-10-CM

## 2023-08-16 MED ORDER — DESOGESTREL-ETHINYL ESTRADIOL 0.15-0.02/0.01 MG (21/5) PO TABS: 1.0000 | ORAL_TABLET | Freq: Every day | ORAL | 1 refills | Status: DC

## 2023-08-16 NOTE — Progress Notes (Signed)
 Patient presents today to discuss history of dysmenorrhea and menorrhagia. Reports very heavy cycles with severe cramping and nausea.

## 2023-08-16 NOTE — Progress Notes (Signed)
 HPI:      [name] is a 33 y.o. G0P0000 who LMP was Patient's last menstrual period was 08/10/2023.  Subjective:   She presents today with complaint of very heavy menstrual bleeding that last 7 days with each cycle.  She is unhappy with this pattern and would like to consider something to make her cycles more manageable.  She reports no personal history of uterine fibroids although she believes people in her family have had fibroids.  She has tried Depo-Provera  before but says she bled the entire time. She is not currently sexually active. She is losing weight-has currently lost over 60 pounds. (Intentional)    Hx: The following portions of the patient's history were reviewed and updated as appropriate:             She  has a past medical history of GERD (gastroesophageal reflux disease) and Insomnia. She does not have any pertinent problems on file. She  has a past surgical history that includes No past surgeries. Her family history includes Hypertension in her mother. She  reports that she has never smoked. She has never used smokeless tobacco. She reports current alcohol use. She reports that she does not use drugs. She has a current medication list which includes the following prescription(s): albuterol , desogestrel -ethinyl estradiol , fluocinonide, ipratropium, ketoconazole, levocetirizine, ondansetron , zepbound , vitamin d  (ergocalciferol ), and trazodone . She is allergic to latex.       Review of Systems:  Review of Systems  Constitutional: Denied constitutional symptoms, night sweats, recent illness, fatigue, fever, insomnia and weight loss.  Eyes: Denied eye symptoms, eye pain, photophobia, vision change and visual disturbance.  Ears/Nose/Throat/Neck: Denied ear, nose, throat or neck symptoms, hearing loss, nasal discharge, sinus congestion and sore throat.  Cardiovascular: Denied cardiovascular symptoms, arrhythmia, chest pain/pressure, edema, exercise intolerance, orthopnea  and palpitations.  Respiratory: Denied pulmonary symptoms, asthma, pleuritic pain, productive sputum, cough, dyspnea and wheezing.  Gastrointestinal: Denied, gastro-esophageal reflux, melena, nausea and vomiting.  Genitourinary: See HPI for additional information.  Musculoskeletal: Denied musculoskeletal symptoms, stiffness, swelling, muscle weakness and myalgia.  Dermatologic: Denied dermatology symptoms, rash and scar.  Neurologic: Denied neurology symptoms, dizziness, headache, neck pain and syncope.  Psychiatric: Denied psychiatric symptoms, anxiety and depression.  Endocrine: Denied endocrine symptoms including hot flashes and night sweats.   Meds:   Current Outpatient Medications on File Prior to Visit  Medication Sig Dispense Refill   albuterol  (VENTOLIN  HFA) 108 (90 Base) MCG/ACT inhaler Inhale 2 puffs into the lungs every 6 (six) hours as needed for wheezing or shortness of breath. 8 g 0   fluocinonide (LIDEX) 0.05 % external solution APPLY EXTERNALLY TO THE AFFECTED AREA OF SCALP EVERY DAY AS NEEDED FOR FLARES     ipratropium (ATROVENT ) 0.03 % nasal spray Place 2 sprays into both nostrils 3 (three) times daily as needed for rhinitis. 30 mL 0   ketoconazole (NIZORAL) 2 % shampoo WASH SCALP AND FACE 3 TIMES A WEEK AS MAINTENANCE     levocetirizine (XYZAL ) 5 MG tablet Take 1 tablet (5 mg total) by mouth every evening. 30 tablet 0   ondansetron  (ZOFRAN ) 4 MG tablet Take 1 tablet (4 mg total) by mouth every 8 (eight) hours as needed for nausea or vomiting. 20 tablet 0   tirzepatide  (ZEPBOUND ) 15 MG/0.5ML Pen Inject 15 mg into the skin once a week. 2 mL 3   Vitamin D , Ergocalciferol , (DRISDOL ) 1.25 MG (50000 UNIT) CAPS capsule Take 1 capsule (50,000 Units total) by mouth every 7 (  seven) days. 26 capsule 0   traZODone  (DESYREL ) 50 MG tablet TAKE 1/2 TO 1 TABLET(25 TO 50 MG) BY MOUTH AT BEDTIME AS NEEDED FOR SLEEP (Patient not taking: Reported on 07/05/2023) 90 tablet 1   No current  facility-administered medications on file prior to visit.      Objective:     Vitals:   08/16/23 0916  BP: 128/88  Pulse: 80   Filed Weights   08/16/23 0916  Weight: 285 lb 4.8 oz (129.4 kg)                        Assessment:    G0P0000 Patient Active Problem List   Diagnosis Date Noted   Annual physical exam 07/05/2023   Iron deficiency anemia due to chronic blood loss 04/08/2022   Primary insomnia 04/08/2022   Elevated LDL cholesterol level 04/08/2022   Spasm of left piriformis muscle 04/08/2022   Carpal tunnel syndrome of right wrist 04/08/2022   Abnormal CBC 11/24/2021   Morbid obesity (HCC) 05/23/2021   Prediabetes 02/21/2021   Menorrhagia with regular cycle 02/21/2021   GERD (gastroesophageal reflux disease) 05/28/2015   Allergic rhinitis 08/17/2014     1. Establishing care with new doctor, encounter for   2. Menorrhagia with regular cycle   3. Dysmenorrhea        Plan:            1.  Ultrasound to rule out uterine fibroids or other pelvic pathology causing menorrhagia.  2.  Discussed multiple options with her including IUD, OCPs, endometrial ablation, hysterectomy, Nexplanon and systematic NSAID S..  Risk benefits of each were reviewed in detail.  Patient states that she has not completed childbearing. She would like to try OCPs as these have worked in the past when she took a progesterone dominant OCP.  Have discussed concurrent use of NSAID S. Will start Mircette  with next menses. OCPs The risks /benefits of OCPs have been explained to the patient in detail.  Product literature has been given to her where appropriate.  I have instructed her in the use of OCPs.  I have explained to the patient that OCPs are not as effective for birth control during the first month of use, and that another form of contraception should be used during this time.  Both first-day start and Sunday start have been explained.  The risks and benefits of each was discussed.  She  has been made aware of  the fact that in rare circumstances, other medications may affect the efficacy of OCPs.  I have answered all of her questions, and I believe that she has an understanding of the effectiveness and use of OCPs.  Should she fail OCPs she would consider IUD. Orders Orders Placed This Encounter  Procedures   US  PELVIS (TRANSABDOMINAL ONLY)     Meds ordered this encounter  Medications   desogestrel -ethinyl estradiol  (MIRCETTE ) 0.15-0.02/0.01 MG (21/5) tablet    Sig: Take 1 tablet by mouth at bedtime.    Dispense:  84 tablet    Refill:  1      F/U  Return in about 4 months (around 12/16/2023) for We will contact her with any abnormal test results.  Alm DOROTHA Sar, M.D. 08/16/2023 9:46 AM

## 2023-08-24 ENCOUNTER — Other Ambulatory Visit

## 2023-09-16 ENCOUNTER — Other Ambulatory Visit

## 2023-09-16 ENCOUNTER — Telehealth: Payer: Self-pay | Admitting: Certified Nurse Midwife

## 2023-09-16 NOTE — Telephone Encounter (Signed)
 Reached out to pt to reschedule gyn US  that was scheduled on 09/16/2023 at 4:00.  (US  originally ordered by Dr. Janit.)  Left message for pt to call back to reschedule.

## 2023-09-17 ENCOUNTER — Encounter: Payer: Self-pay | Admitting: Certified Nurse Midwife

## 2023-09-17 NOTE — Telephone Encounter (Signed)
 Reached out to pt (2x) to reschedule gyn US  that was scheduled on 09/16/2023 at 4:00.  (US  originally ordered by Dr. Janit.)  Left message for pt to call back to reschedule.  Will send a MyChart letter to pt.

## 2023-09-20 ENCOUNTER — Encounter: Payer: Self-pay | Admitting: Obstetrics

## 2023-09-22 ENCOUNTER — Other Ambulatory Visit: Payer: Self-pay | Admitting: Family Medicine

## 2023-10-24 ENCOUNTER — Other Ambulatory Visit: Payer: Self-pay | Admitting: Family Medicine

## 2023-10-24 DIAGNOSIS — E559 Vitamin D deficiency, unspecified: Secondary | ICD-10-CM

## 2023-11-04 ENCOUNTER — Ambulatory Visit: Admitting: Family Medicine

## 2023-11-04 ENCOUNTER — Encounter: Payer: Self-pay | Admitting: Family Medicine

## 2023-11-04 ENCOUNTER — Other Ambulatory Visit: Payer: Self-pay | Admitting: Family Medicine

## 2023-11-04 VITALS — BP 128/88 | HR 78 | Temp 98.4°F | Ht 68.0 in | Wt 285.1 lb

## 2023-11-04 DIAGNOSIS — E66813 Obesity, class 3: Secondary | ICD-10-CM | POA: Diagnosis not present

## 2023-11-04 DIAGNOSIS — J301 Allergic rhinitis due to pollen: Secondary | ICD-10-CM | POA: Diagnosis not present

## 2023-11-04 DIAGNOSIS — Z23 Encounter for immunization: Secondary | ICD-10-CM

## 2023-11-04 DIAGNOSIS — R110 Nausea: Secondary | ICD-10-CM

## 2023-11-04 DIAGNOSIS — E559 Vitamin D deficiency, unspecified: Secondary | ICD-10-CM

## 2023-11-04 DIAGNOSIS — R5383 Other fatigue: Secondary | ICD-10-CM

## 2023-11-04 DIAGNOSIS — R030 Elevated blood-pressure reading, without diagnosis of hypertension: Secondary | ICD-10-CM

## 2023-11-04 MED ORDER — ONDANSETRON HCL 4 MG PO TABS
4.0000 mg | ORAL_TABLET | Freq: Three times a day (TID) | ORAL | 2 refills | Status: AC | PRN
Start: 2023-11-04 — End: ?

## 2023-11-04 MED ORDER — LEVOCETIRIZINE DIHYDROCHLORIDE 5 MG PO TABS: 5.0000 mg | ORAL_TABLET | Freq: Every evening | ORAL | 0 refills | Status: AC

## 2023-11-04 MED ORDER — IPRATROPIUM BROMIDE 0.03 % NA SOLN
2.0000 | Freq: Three times a day (TID) | NASAL | 0 refills | Status: DC | PRN
Start: 2023-11-04 — End: 2023-11-29

## 2023-11-04 NOTE — Patient Instructions (Signed)
 To keep you healthy, please keep in mind the following health maintenance items that you are due for:   Health Maintenance Due  Topic Date Due   HPV VACCINES (3 - 3-dose series) 03/24/2007   Influenza Vaccine  08/06/2023     Best Wishes,   Dr. Lang

## 2023-11-04 NOTE — Progress Notes (Signed)
 Established patient visit   Patient: Brianna Villarreal   DOB: 12/01/1990   33 y.o. Female  MRN: [id number] Visit Date: 11/04/2023  Today's healthcare provider: Rockie Agent, MD   Chief Complaint  Patient presents with   Medical Management of Chronic Issues    Patient is present for follow up weight, reports doing okay just nauseated and fatigued from zepbound  and starting birth control- requests refill zofran .    Weight Check   Subjective     HPI     Medical Management of Chronic Issues    Additional comments: Patient is present for follow up weight, reports doing okay just nauseated and fatigued from zepbound  and starting birth control- requests refill zofran .       Last edited by Cherry Chiquita HERO, CMA on 11/04/2023  2:37 PM.       Discussed the use of AI scribe software for clinical note transcription with the patient, who gave verbal consent to proceed.  History of Present Illness Brianna Villarreal is a 33 year old female with obesity who presents for a follow-up visit.  She is on tirzepatide  15 mg weekly for weight loss. Her BMI has decreased from 48 to 43, and her weight has reduced from 319 pounds in April to 285 pounds today. However, she has gained 7 pounds since her last visit in June when she weighed 278 pounds. She attributes some of the weight gain to starting birth control again, which she noticed coincided with the weight increase.  She is seeking a refill for her oral contraceptive pills, Miracet 0.5-0.2/0.02 mg daily, which she is using for managing symptoms other than contraception. The first month on birth control was challenging, and she did not have a menstrual cycle this month. She needs to reschedule an ultrasound due to a change in her healthcare provider.  She experiences nausea, particularly in the first two days after taking her tirzepatide  shot, for which she uses Zofran  4 mg every eight hours as needed, typically taking about two doses  during this period.  She reports difficulty with protein intake due to appetite suppression from her medication, stating 'Protein, I be fighting for my life for the protein.' She is trying to maintain her nutrition despite the decreased appetite.  She experiences headaches when her blood pressure is high. She monitors her blood pressure at home.  She feels fatigued and is unsure if it is related to her medications or vitamin D  and iron levels. Her vitamin D  level was 26 previously, and she is currently on a maintenance dose of 2000 IU. She also works long hours as a licensed conveyancer lead, which may contribute to her fatigue.     Past Medical History:  Diagnosis Date   Allergy    Anemia    Anxiety    GERD (gastroesophageal reflux disease)    Insomnia     Medications: Outpatient Medications Prior to Visit  Medication Sig   ZEPBOUND  15 MG/0.5ML Pen ADMINISTER 15 MG UNDER THE SKIN 1 TIME A WEEK   albuterol  (VENTOLIN  HFA) 108 (90 Base) MCG/ACT inhaler Inhale 2 puffs into the lungs every 6 (six) hours as needed for wheezing or shortness of breath.   desogestrel -ethinyl estradiol  (MIRCETTE ) 0.15-0.02/0.01 MG (21/5) tablet Take 1 tablet by mouth at bedtime.   fluocinonide (LIDEX) 0.05 % external solution APPLY EXTERNALLY TO THE AFFECTED AREA OF SCALP EVERY DAY AS NEEDED FOR FLARES   ketoconazole (NIZORAL) 2 % shampoo WASH SCALP AND  FACE 3 TIMES A WEEK AS MAINTENANCE   traZODone  (DESYREL ) 50 MG tablet TAKE 1/2 TO 1 TABLET(25 TO 50 MG) BY MOUTH AT BEDTIME AS NEEDED FOR SLEEP (Patient not taking: Reported on 07/05/2023)   Vitamin D , Ergocalciferol , (DRISDOL ) 1.25 MG (50000 UNIT) CAPS capsule TAKE 1 CAPSULE BY MOUTH EVERY 7 DAYS   [DISCONTINUED] ipratropium (ATROVENT ) 0.03 % nasal spray Place 2 sprays into both nostrils 3 (three) times daily as needed for rhinitis.   [DISCONTINUED] levocetirizine (XYZAL ) 5 MG tablet Take 1 tablet (5 mg total) by mouth every evening.   [DISCONTINUED] ondansetron   (ZOFRAN ) 4 MG tablet Take 1 tablet (4 mg total) by mouth every 8 (eight) hours as needed for nausea or vomiting.   No facility-administered medications prior to visit.    Review of Systems  Last metabolic panel Lab Results  Component Value Date   GLUCOSE 89 11/04/2023   NA 140 11/04/2023   K 4.4 11/04/2023   CL 103 11/04/2023   CO2 21 11/04/2023   BUN 10 11/04/2023   CREATININE 0.82 11/04/2023   EGFR 96 11/04/2023   CALCIUM 9.4 11/04/2023   PROT 7.2 07/05/2023   ALBUMIN 4.3 07/05/2023   LABGLOB 2.9 07/05/2023   AGRATIO 1.4 11/24/2021   BILITOT <0.2 07/05/2023   ALKPHOS 81 07/05/2023   AST 19 07/05/2023   ALT 10 07/05/2023   ANIONGAP 6 08/08/2015   Last lipids Lab Results  Component Value Date   CHOL 186 07/05/2023   HDL 43 07/05/2023   LDLCALC 115 (H) 07/05/2023   TRIG 156 (H) 07/05/2023   CHOLHDL 4.3 07/05/2023   Last hemoglobin A1c Lab Results  Component Value Date   HGBA1C 5.7 (H) 07/05/2023   Last thyroid functions Lab Results  Component Value Date   TSH 1.370 11/04/2023   FREET4 1.16 11/04/2023   Last vitamin D  Lab Results  Component Value Date   VD25OH 31.7 11/04/2023        Objective    BP 128/88 (Cuff Size: Large)   Pulse 78   Temp 98.4 F (36.9 C) (Oral)   Ht 5' 8 (1.727 m)   Wt 285 lb 1.6 oz (129.3 kg)   LMP 09/28/2023 (Exact Date)   SpO2 100%   BMI 43.35 kg/m  BP Readings from Last 3 Encounters:  11/04/23 128/88  08/16/23 128/88  07/05/23 122/80   Wt Readings from Last 3 Encounters:  11/04/23 285 lb 1.6 oz (129.3 kg)  08/16/23 285 lb 4.8 oz (129.4 kg)  07/05/23 278 lb (126.1 kg)        Physical Exam Vitals reviewed.  Constitutional:      General: She is not in acute distress.    Appearance: Normal appearance. She is not ill-appearing.  Cardiovascular:     Rate and Rhythm: Normal rate and regular rhythm.  Pulmonary:     Effort: Pulmonary effort is normal. No respiratory distress.     Breath sounds: No wheezing,  rhonchi or rales.  Neurological:     Mental Status: She is alert and oriented to person, place, and time.  Psychiatric:        Mood and Affect: Mood normal.        Behavior: Behavior normal.       Results for orders placed or performed in visit on 11/04/23  TSH + free T4  Result Value Ref Range   TSH 1.370 0.450 - 4.500 uIU/mL   Free T4 1.16 0.82 - 1.77 ng/dL  VITAMIN D  25 Hydroxy (Vit-D  Deficiency, Fractures)  Result Value Ref Range   Vit D, 25-Hydroxy 31.7 30.0 - 100.0 ng/mL  Vitamin B12  Result Value Ref Range   Vitamin B-12 493 232 - 1,245 pg/mL  BMP8+EGFR  Result Value Ref Range   Glucose 89 70 - 99 mg/dL   BUN 10 6 - 20 mg/dL   Creatinine, Ser 9.17 0.57 - 1.00 mg/dL   eGFR 96 >40 fO/fpw/8.26   BUN/Creatinine Ratio 12 9 - 23   Sodium 140 134 - 144 mmol/L   Potassium 4.4 3.5 - 5.2 mmol/L   Chloride 103 96 - 106 mmol/L   CO2 21 20 - 29 mmol/L   Calcium 9.4 8.7 - 10.2 mg/dL    Assessment & Plan     Problem List Items Addressed This Visit     Allergic rhinitis - Primary   Allergic rhinitis, Chronic condition  Exacerbated by weather changes, managed with Atrovent  nasal spray and Xyzal  5 mg. - Refills provided, patient will continue Atrovent  nasal spray - Refilled, continue Xyzal  5 mg      Relevant Medications   ipratropium (ATROVENT ) 0.03 % nasal spray   levocetirizine (XYZAL ) 5 MG tablet   Obesity, Class III, BMI 40-49.9 (morbid obesity) (HCC)   Obesity, Class III BMI 43.35  Obesity with BMI decrease from 48 to 43. Weight decreased from 319 pounds in April to 285 pounds today, with a recent gain of 7 pounds since June. Weight gain may be related to oral contraceptive pills affecting tirzepatide  pharmacokinetics. - Continue tirzepatide  15 mg weekly - Encourage adequate protein intake to maintain muscle mass - Monitor weight and dietary intake      Other Visit Diagnoses       Nausea       Relevant Medications   ondansetron  (ZOFRAN ) 4 MG tablet   Other  Relevant Orders   BMP8+EGFR (Completed)     Need for influenza vaccination       Relevant Orders   Flu vaccine trivalent PF, 6mos and older(Flulaval,Afluria,Fluarix,Fluzone) (Completed)     Elevated blood pressure reading       Relevant Orders   TSH + free T4 (Completed)   BMP8+EGFR (Completed)     Other fatigue       Relevant Orders   TSH + free T4 (Completed)   VITAMIN D  25 Hydroxy (Vit-D Deficiency, Fractures) (Completed)   Vitamin B12 (Completed)     Vitamin D  deficiency       Relevant Orders   VITAMIN D  25 Hydroxy (Vit-D Deficiency, Fractures) (Completed)       Assessment and Plan Assessment & Plan   Nausea Nausea occurs primarily during the first two days after tirzepatide  administration. Uses Zofran  4 mg as needed, typically two doses during this period. - Refill Zofran  4 mg every 8 hours as needed for nausea  Contraceptive management On Miracet 0.5-0.2/0.02 mg daily. Reports initial difficulty but currently in the second month without a menstrual cycle. Ultrasound needs rescheduling due to provider change. - Continue Miracet 0.5-0.2/0.02 mg daily - Reschedule ultrasound appointment  Hypertension Blood pressure elevated at 131/98 mmHg with associated headaches. - Monitor blood pressure at home    Fatigue & Health Maintenance  Intermittent symptoms  Received flu shot. Vitamin D  previously low at 26 ng/mL, on supplementation. Experiencing fatigue, possibly related to vitamin D  or iron levels. - Check vitamin D , TSH, T4, vitamin B12, and BMP - Continue vitamin D  supplementation as prescribed     Return in about 4 months (around 03/04/2024)  for Weight MGMT.         Rockie Agent, MD  [facility] [phone] (phone) [phone] (fax)  [facility]

## 2023-11-05 LAB — BMP8+EGFR
BUN/Creatinine Ratio: 12 (ref 9–23)
BUN: 10 mg/dL (ref 6–20)
CO2: 21 mmol/L (ref 20–29)
Calcium: 9.4 mg/dL (ref 8.7–10.2)
Chloride: 103 mmol/L (ref 96–106)
Creatinine, Ser: 0.82 mg/dL (ref 0.57–1.00)
Glucose: 89 mg/dL (ref 70–99)
Potassium: 4.4 mmol/L (ref 3.5–5.2)
Sodium: 140 mmol/L (ref 134–144)
eGFR: 96 mL/min/1.73 (ref >59)

## 2023-11-05 LAB — VITAMIN D 25 HYDROXY (VIT D DEFICIENCY, FRACTURES): Vit D, 25-Hydroxy: 31.7 ng/mL (ref 30.0–100.0)

## 2023-11-05 LAB — TSH+FREE T4
Free T4: 1.16 ng/dL (ref 0.82–1.77)
TSH: 1.37 u[IU]/mL (ref 0.450–4.500)

## 2023-11-05 LAB — VITAMIN B12: Vitamin B-12: 493 pg/mL (ref 232–1245)

## 2023-11-07 ENCOUNTER — Ambulatory Visit: Payer: Self-pay | Admitting: Family Medicine

## 2023-11-07 NOTE — Assessment & Plan Note (Signed)
 Obesity, Class III BMI 43.35  Obesity with BMI decrease from 48 to 43. Weight decreased from 319 pounds in April to 285 pounds today, with a recent gain of 7 pounds since June. Weight gain may be related to oral contraceptive pills affecting tirzepatide  pharmacokinetics. - Continue tirzepatide  15 mg weekly - Encourage adequate protein intake to maintain muscle mass - Monitor weight and dietary intake

## 2023-11-07 NOTE — Assessment & Plan Note (Signed)
 Allergic rhinitis, Chronic condition  Exacerbated by weather changes, managed with Atrovent  nasal spray and Xyzal  5 mg. - Refills provided, patient will continue Atrovent  nasal spray - Refilled, continue Xyzal  5 mg

## 2023-11-29 ENCOUNTER — Other Ambulatory Visit: Payer: Self-pay | Admitting: Family Medicine

## 2023-11-29 DIAGNOSIS — J301 Allergic rhinitis due to pollen: Secondary | ICD-10-CM

## 2023-12-25 ENCOUNTER — Other Ambulatory Visit: Payer: Self-pay | Admitting: Family Medicine

## 2023-12-25 DIAGNOSIS — J301 Allergic rhinitis due to pollen: Secondary | ICD-10-CM

## 2024-02-01 ENCOUNTER — Other Ambulatory Visit: Payer: Self-pay

## 2024-02-01 DIAGNOSIS — N946 Dysmenorrhea, unspecified: Secondary | ICD-10-CM

## 2024-02-01 DIAGNOSIS — N920 Excessive and frequent menstruation with regular cycle: Secondary | ICD-10-CM

## 2024-02-01 MED ORDER — DESOGESTREL-ETHINYL ESTRADIOL 0.15-0.02/0.01 MG (21/5) PO TABS: 1.0000 | ORAL_TABLET | Freq: Every day | ORAL | 1 refills | Status: AC

## 2024-03-06 ENCOUNTER — Ambulatory Visit: Admitting: Family Medicine

## 2024-04-10 ENCOUNTER — Ambulatory Visit: Admitting: Family Medicine
